# Patient Record
Sex: Male | Born: 1965 | Race: Black or African American | Hispanic: No | Marital: Married | Smoking: Former smoker
Health system: Southern US, Community
[De-identification: ages and names within clinical notes are randomized; demographics above are authoritative.]

## PROBLEM LIST (undated history)

## (undated) DIAGNOSIS — R112 Nausea with vomiting, unspecified: Secondary | ICD-10-CM

## (undated) DIAGNOSIS — Z8719 Personal history of other diseases of the digestive system: Secondary | ICD-10-CM

## (undated) DIAGNOSIS — I10 Essential (primary) hypertension: Secondary | ICD-10-CM

## (undated) DIAGNOSIS — Z9889 Other specified postprocedural states: Secondary | ICD-10-CM

## (undated) HISTORY — PX: KNEE ARTHROSCOPY W/ MENISCAL REPAIR: SHX1877

## (undated) HISTORY — PX: HERNIA REPAIR: SHX51

---

## 1998-09-06 ENCOUNTER — Emergency Department (HOSPITAL_COMMUNITY): Admission: EM | Admit: 1998-09-06 | Discharge: 1998-09-07 | Payer: Self-pay | Admitting: Emergency Medicine

## 1998-09-14 ENCOUNTER — Encounter: Admission: RE | Admit: 1998-09-14 | Discharge: 1998-09-14 | Payer: Self-pay | Admitting: *Deleted

## 1999-01-11 ENCOUNTER — Emergency Department (HOSPITAL_COMMUNITY): Admission: EM | Admit: 1999-01-11 | Discharge: 1999-01-11 | Payer: Self-pay | Admitting: Emergency Medicine

## 1999-05-12 ENCOUNTER — Encounter: Payer: Self-pay | Admitting: Emergency Medicine

## 1999-05-12 ENCOUNTER — Emergency Department (HOSPITAL_COMMUNITY): Admission: EM | Admit: 1999-05-12 | Discharge: 1999-05-13 | Payer: Self-pay | Admitting: Emergency Medicine

## 1999-07-06 ENCOUNTER — Ambulatory Visit (HOSPITAL_BASED_OUTPATIENT_CLINIC_OR_DEPARTMENT_OTHER): Admission: RE | Admit: 1999-07-06 | Discharge: 1999-07-06 | Payer: Self-pay | Admitting: General Surgery

## 1999-09-24 ENCOUNTER — Emergency Department (HOSPITAL_COMMUNITY): Admission: EM | Admit: 1999-09-24 | Discharge: 1999-09-25 | Payer: Self-pay | Admitting: Emergency Medicine

## 1999-11-24 ENCOUNTER — Emergency Department (HOSPITAL_COMMUNITY): Admission: EM | Admit: 1999-11-24 | Discharge: 1999-11-24 | Payer: Self-pay

## 2000-01-12 ENCOUNTER — Emergency Department (HOSPITAL_COMMUNITY): Admission: EM | Admit: 2000-01-12 | Discharge: 2000-01-12 | Payer: Self-pay | Admitting: Emergency Medicine

## 2000-01-14 ENCOUNTER — Emergency Department (HOSPITAL_COMMUNITY): Admission: EM | Admit: 2000-01-14 | Discharge: 2000-01-14 | Payer: Self-pay | Admitting: Emergency Medicine

## 2000-01-21 ENCOUNTER — Emergency Department (HOSPITAL_COMMUNITY): Admission: EM | Admit: 2000-01-21 | Discharge: 2000-01-21 | Payer: Self-pay | Admitting: Emergency Medicine

## 2000-02-18 ENCOUNTER — Encounter: Payer: Self-pay | Admitting: Family Medicine

## 2000-02-18 ENCOUNTER — Ambulatory Visit (HOSPITAL_COMMUNITY): Admission: RE | Admit: 2000-02-18 | Discharge: 2000-02-18 | Payer: Self-pay | Admitting: Family Medicine

## 2000-03-21 ENCOUNTER — Encounter: Payer: Self-pay | Admitting: Family Medicine

## 2000-03-21 ENCOUNTER — Ambulatory Visit (HOSPITAL_COMMUNITY): Admission: RE | Admit: 2000-03-21 | Discharge: 2000-03-21 | Payer: Self-pay | Admitting: Family Medicine

## 2000-03-28 ENCOUNTER — Encounter: Admission: RE | Admit: 2000-03-28 | Discharge: 2000-04-18 | Payer: Self-pay | Admitting: Family Medicine

## 2001-07-02 ENCOUNTER — Emergency Department (HOSPITAL_COMMUNITY): Admission: EM | Admit: 2001-07-02 | Discharge: 2001-07-02 | Payer: Self-pay | Admitting: Emergency Medicine

## 2002-12-01 ENCOUNTER — Emergency Department (HOSPITAL_COMMUNITY): Admission: EM | Admit: 2002-12-01 | Discharge: 2002-12-01 | Payer: Self-pay | Admitting: Emergency Medicine

## 2002-12-01 ENCOUNTER — Encounter: Payer: Self-pay | Admitting: Emergency Medicine

## 2006-08-01 ENCOUNTER — Emergency Department: Payer: Self-pay | Admitting: Internal Medicine

## 2006-08-01 ENCOUNTER — Other Ambulatory Visit: Payer: Self-pay

## 2006-08-02 ENCOUNTER — Ambulatory Visit: Payer: Self-pay | Admitting: Internal Medicine

## 2012-07-26 ENCOUNTER — Emergency Department: Payer: Self-pay | Admitting: Emergency Medicine

## 2012-12-12 ENCOUNTER — Other Ambulatory Visit: Payer: Self-pay | Admitting: Orthopedic Surgery

## 2012-12-17 ENCOUNTER — Encounter (HOSPITAL_COMMUNITY): Payer: Self-pay | Admitting: Pharmacy Technician

## 2012-12-18 ENCOUNTER — Other Ambulatory Visit (HOSPITAL_COMMUNITY): Payer: Self-pay

## 2012-12-18 NOTE — Pre-Procedure Instructions (Signed)
Marcus Turner  12/18/2012   Your procedure is scheduled on: Monday, August 4th    Report to [facility] at 5:30 AM.             (Come through Entrance "A", sign in at desk)  Call this number if you have problems the morning of surgery: [phone]   Remember:   Do not eat food or drink liquids after midnight Sunday.   Take these medicines the morning of surgery with A SIP OF WATER: Norvasc   Do not wear jewelry, no rings or watches.  Do not wear lotions, powders, or colognes. You may NOT wear deodorant.             Men may shave face and neck.   Do not bring valuables to the hospital.  [facility] is not responsible for any belongings or valuables.  Contacts, dentures or bridgework may not be worn into surgery.   Leave suitcase in the car. After surgery it may be brought to your room.  For patients admitted to the hospital, checkout time is 11:00 AM the day of discharge.   Name and phone number of your driver:    Special Instructions: Shower using CHG 2 nights before surgery and the night before surgery.  If you shower the day of surgery use CHG.  Use special wash - you have one bottle of CHG for all showers.  You should use approximately 1/3 of the bottle for each shower.   Please read over the following fact sheets that you were given: Pain Booklet, Coughing and Deep Breathing, Blood Transfusion Information, MRSA Information and Surgical Site Infection Prevention

## 2012-12-19 ENCOUNTER — Encounter (HOSPITAL_COMMUNITY)
Admission: RE | Admit: 2012-12-19 | Discharge: 2012-12-19 | Disposition: A | Discharge status: Home / Self Care | Payer: Self-pay | Source: Ambulatory Visit | Attending: Orthopedic Surgery | Admitting: Orthopedic Surgery

## 2012-12-19 ENCOUNTER — Encounter (HOSPITAL_COMMUNITY): Payer: Self-pay

## 2012-12-19 ENCOUNTER — Encounter (HOSPITAL_COMMUNITY)
Admission: RE | Admit: 2012-12-19 | Discharge: 2012-12-19 | Disposition: A | Discharge status: Home / Self Care | Payer: Worker's Compensation | Source: Ambulatory Visit | Attending: Orthopedic Surgery | Admitting: Orthopedic Surgery

## 2012-12-19 DIAGNOSIS — IMO0002 Reserved for concepts with insufficient information to code with codable children: Secondary | ICD-10-CM | POA: Insufficient documentation

## 2012-12-19 DIAGNOSIS — I10 Essential (primary) hypertension: Secondary | ICD-10-CM | POA: Insufficient documentation

## 2012-12-19 DIAGNOSIS — M1710 Unilateral primary osteoarthritis, unspecified knee: Secondary | ICD-10-CM | POA: Insufficient documentation

## 2012-12-19 DIAGNOSIS — Z01818 Encounter for other preprocedural examination: Secondary | ICD-10-CM | POA: Insufficient documentation

## 2012-12-19 HISTORY — DX: Essential (primary) hypertension: I10

## 2012-12-19 HISTORY — DX: Other specified postprocedural states: R11.2

## 2012-12-19 HISTORY — DX: Personal history of other diseases of the digestive system: Z87.19

## 2012-12-19 HISTORY — DX: Other specified postprocedural states: Z98.890

## 2012-12-19 LAB — URINALYSIS, ROUTINE W REFLEX MICROSCOPIC
Bilirubin Urine: NEGATIVE
Hgb urine dipstick: NEGATIVE
Ketones, ur: NEGATIVE mg/dL
Protein, ur: NEGATIVE mg/dL
Urobilinogen, UA: 0.2 mg/dL (ref 0.0–1.0)

## 2012-12-19 LAB — CBC WITH DIFFERENTIAL/PLATELET
Eosinophils Absolute: 0.1 10*3/uL (ref 0.0–0.7)
Eosinophils Relative: 2 % (ref 0–5)
Lymphs Abs: 1.8 10*3/uL (ref 0.7–4.0)
MCH: 28.2 pg (ref 26.0–34.0)
MCHC: 35.2 g/dL (ref 30.0–36.0)
MCV: 80.2 fL (ref 78.0–100.0)
Platelets: 287 10*3/uL (ref 150–400)
RBC: 5.31 MIL/uL (ref 4.22–5.81)

## 2012-12-19 LAB — PROTIME-INR: Prothrombin Time: 13.1 seconds (ref 11.6–15.2)

## 2012-12-19 LAB — ABO/RH: ABO/RH(D): B POS

## 2012-12-19 LAB — SURGICAL PCR SCREEN: MRSA, PCR: NEGATIVE

## 2012-12-19 LAB — BASIC METABOLIC PANEL
BUN: 18 mg/dL (ref 6–23)
CO2: 28 mEq/L (ref 19–32)
Calcium: 9.8 mg/dL (ref 8.4–10.5)
Creatinine, Ser: 1.16 mg/dL (ref 0.50–1.35)
Glucose, Bld: 93 mg/dL (ref 70–99)
Sodium: 139 mEq/L (ref 135–145)

## 2012-12-19 LAB — TYPE AND SCREEN

## 2012-12-19 NOTE — Progress Notes (Signed)
I have called his PCP--Dr. Terance Hart in [place] [facility])  [phone]   They do not have an old EKG for comparison.  DA

## 2012-12-19 NOTE — Progress Notes (Addendum)
12/19/12 1610  OBSTRUCTIVE SLEEP APNEA  Have you ever been diagnosed with sleep apnea through a sleep study? No  Do you snore loudly (loud enough to be heard through closed doors)?  0  Do you often feel tired, fatigued, or sleepy during the daytime? 1 (since he isn't active)  Has anyone observed you stop breathing during your sleep? 0  Do you have, or are you being treated for high blood pressure? 1  BMI more than 35 kg/m2? 1  Age over 48 years old? 0  Neck circumference greater than 40 cm/18 inches? 1  Gender: 1  Obstructive Sleep Apnea Score 5  Score 4 or greater  Results sent to PCP   He has scored a  5

## 2012-12-19 NOTE — H&P (Signed)
Marcus Turner is an 47 y.o. male.   Chief Complaint: Left knee pain with end stage arthritis  HPI: Patient presents with a chief complaint of left knee pain that was the result of a fall at work on 07/26/2012.  The patient is been treated at [facility] by Dr. Rodolph Bong, and most recently Dr. Durene Romans.  X-ray and MRI scan is consistent with bone-on-bone arthritis to the medial compartment of the left knee with subchondral edema complete loss of cartilage and peripheral osteophytes.  The patient is failed treatment with cortisone injections, physical therapy, Viscosupplementation, and oral medications.  On his most recent visit with Dr. Charlann Boxer discussion of knee replacement to place and that is pretty much the patient's desire.  It was Dr. Nilsa Nutting opinion that the work related fall represent an exacerbation of a pre-existing condition since the patient did not have any significant symptoms prior to the fall.  Past Medical History  Diagnosis Date  . PONV (postoperative nausea and vomiting)     some nausea...no vomiting  . Hypertension   . H/O hiatal hernia     Past Surgical History  Procedure Laterality Date  . Knee arthroscopy w/ meniscal repair      right  . Hernia repair      ventral    No family history on file. Social History:  reports that he has quit smoking. He does not have any smokeless tobacco history on file. He reports that  drinks alcohol. He reports that he does not use illicit drugs.  Allergies:  Allergies  Allergen Reactions  . Tramadol Nausea Only    No prescriptions prior to admission    Results for orders placed during the hospital encounter of 12/19/12 (from the past 48 hour(s))  URINALYSIS, ROUTINE W REFLEX MICROSCOPIC     Status: None   Collection Time    12/19/12  9:01 AM      Result Value Range   Color, Urine YELLOW  YELLOW   APPearance CLEAR  CLEAR   Specific Gravity, Urine 1.021  1.005 - 1.030   pH 6.5  5.0 - 8.0   Glucose, UA NEGATIVE   NEGATIVE mg/dL   Hgb urine dipstick NEGATIVE  NEGATIVE   Bilirubin Urine NEGATIVE  NEGATIVE   Ketones, ur NEGATIVE  NEGATIVE mg/dL   Protein, ur NEGATIVE  NEGATIVE mg/dL   Urobilinogen, UA 0.2  0.0 - 1.0 mg/dL   Nitrite NEGATIVE  NEGATIVE   Leukocytes, UA NEGATIVE  NEGATIVE   Comment: MICROSCOPIC NOT DONE ON URINES WITH NEGATIVE PROTEIN, BLOOD, LEUKOCYTES, NITRITE, OR GLUCOSE <1000 mg/dL.  APTT     Status: None   Collection Time    12/19/12 10:10 AM      Result Value Range   aPTT 25  24 - 37 seconds  BASIC METABOLIC PANEL     Status: Abnormal   Collection Time    12/19/12 10:10 AM      Result Value Range   Sodium 139  135 - 145 mEq/L   Potassium 3.9  3.5 - 5.1 mEq/L   Chloride 103  96 - 112 mEq/L   CO2 28  19 - 32 mEq/L   Glucose, Bld 93  70 - 99 mg/dL   BUN 18  6 - 23 mg/dL   Creatinine, Ser 4.54  0.50 - 1.35 mg/dL   Calcium 9.8  8.4 - 09.8 mg/dL   GFR calc non Af Amer 73 (*) >90 mL/min   GFR calc Af Amer 84 (*) >  90 mL/min   Comment:            The eGFR has been calculated     using the CKD EPI equation.     This calculation has not been     validated in all clinical     situations.     eGFR's persistently     <90 mL/min signify     possible Chronic Kidney Disease.  CBC WITH DIFFERENTIAL     Status: None   Collection Time    12/19/12 10:10 AM      Result Value Range   WBC 5.7  4.0 - 10.5 K/uL   RBC 5.31  4.22 - 5.81 MIL/uL   Hemoglobin 15.0  13.0 - 17.0 g/dL   HCT 16.1  09.6 - 04.5 %   MCV 80.2  78.0 - 100.0 fL   MCH 28.2  26.0 - 34.0 pg   MCHC 35.2  30.0 - 36.0 g/dL   RDW 40.9  81.1 - 91.4 %   Platelets 287  150 - 400 K/uL   Neutrophils Relative % 57  43 - 77 %   Neutro Abs 3.3  1.7 - 7.7 K/uL   Lymphocytes Relative 32  12 - 46 %   Lymphs Abs 1.8  0.7 - 4.0 K/uL   Monocytes Relative 9  3 - 12 %   Monocytes Absolute 0.5  0.1 - 1.0 K/uL   Eosinophils Relative 2  0 - 5 %   Eosinophils Absolute 0.1  0.0 - 0.7 K/uL   Basophils Relative 1  0 - 1 %   Basophils  Absolute 0.0  0.0 - 0.1 K/uL  PROTIME-INR     Status: None   Collection Time    12/19/12 10:10 AM      Result Value Range   Prothrombin Time 13.1  11.6 - 15.2 seconds   INR 1.01  0.00 - 1.49  TYPE AND SCREEN     Status: None   Collection Time    12/19/12 10:15 AM      Result Value Range   ABO/RH(D) B POS     Antibody Screen NEG     Sample Expiration 01/02/2013    ABO/RH     Status: None   Collection Time    12/19/12 10:15 AM      Result Value Range   ABO/RH(D) B POS    SURGICAL PCR SCREEN     Status: None   Collection Time    12/19/12 10:20 AM      Result Value Range   MRSA, PCR NEGATIVE  NEGATIVE   Staphylococcus aureus NEGATIVE  NEGATIVE   Comment:            The Xpert SA Assay (FDA     approved for NASAL specimens     in patients over 61 years of age),     is one component of     a comprehensive surveillance     program.  Test performance has     been validated by [employer] for patients greater     than or equal to 65 year old.     It is not intended     to diagnose infection nor to     guide or monitor treatment.   Dg Chest 2 View  12/19/2012   *RADIOLOGY REPORT*  Clinical Data: Preoperative respiratory exam.  Severe osteoarthritis of the left knee.  CHEST - 2 VIEW  Comparison: None.  Findings: The heart size and pulmonary vascularity are normal and the lungs are clear.  No osseous abnormality.  IMPRESSION: Normal chest.   Original Report Authenticated By: Francene Boyers, M.D.    Review of Systems  Constitutional: Negative.   HENT: Positive for hearing loss.   Eyes: Negative.        Pt wears glasses  Respiratory: Negative.   Cardiovascular: Negative.   Gastrointestinal: Negative.   Genitourinary: Negative.   Musculoskeletal: Positive for joint pain.       Left knee pain  Skin: Negative.   Neurological: Negative.   Endo/Heme/Allergies: Negative.     There were no vitals taken for this visit. Physical Exam  Constitutional: He is oriented to person,  place, and time. He appears well-developed and well-nourished.  HENT:  Head: Normocephalic and atraumatic.  Neck: Normal range of motion. Neck supple.  Cardiovascular: Intact distal pulses.   Respiratory: Effort normal and breath sounds normal.  Musculoskeletal: Normal range of motion. He exhibits tenderness.  Left knee pain  Neurological: He is alert and oriented to person, place, and time. He has normal reflexes.  Skin: Skin is warm and dry.  Psychiatric: He has a normal mood and affect. His behavior is normal. Judgment and thought content normal.     Assessment/Plan Assess: End-stage osteoarthritis of the left knee, exacerbated by injury at work, has failed conservative treatment with anti-inflammatory medicines, cortisone shots, physical therapy, and Viscosupplementation.  Plan: I would concur with Dr. Durene Romans that the patient's only reasonable option at this time is total knee replacement.  The risks benefits of surgery were discussed at length and the patient would like to proceed with surgery.  If the patient desires to have the surgery done here of be happy to do that if it is okay with the insurance adjuster.  Expected hospitalization would be one to 2 days.  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  PHILLIPS, ERIC R 12/19/2012, 4:37 PM

## 2012-12-19 NOTE — Progress Notes (Signed)
Anesthesia chart review:  Patient is a 47 year old male scheduled for left TKA on 12/22/12 by Dr. Turner Daniels.  History includes former smoker, HTN, hiatal hernia, post-operative N/V, morbid obesity. OSA screening score was a 5.  PCP is Dr. Terance Hart in [place].   EKG on 12/22/12 showed NSR, LAFB, non-specific T wave abnormality.  There are no comparison EKGs in [facility], Epic, or at his PCP office.  No CV symptoms were documented at his PAT appointment.  He has no documented history of MI/CHF or DM.  Preoperative CXR and labs noted.  Patient will be evaluated by his assigned anesthesiologist on the day of surgery.  If he remains asymptomatic from a CV standpoint then I would anticipate that he could proceed as planned.  Velna Ochs [facility]/Anesthesiology Phone [phone] 12/19/2012 12:24 PM

## 2012-12-21 DIAGNOSIS — M1712 Unilateral primary osteoarthritis, left knee: Secondary | ICD-10-CM

## 2012-12-21 MED ORDER — CHLORHEXIDINE GLUCONATE 4 % EX LIQD: 60.0000 mL | Freq: Once | CUTANEOUS | Status: DC

## 2012-12-21 MED ORDER — DEXTROSE-NACL 5-0.45 % IV SOLN: INTRAVENOUS | Status: DC

## 2012-12-21 MED ORDER — CEFAZOLIN SODIUM-DEXTROSE 2-3 GM-% IV SOLR
2.0000 g | INTRAVENOUS | Status: AC
  Administered 2012-12-22: 2 g via INTRAVENOUS
  Filled 2012-12-21: qty 50

## 2012-12-22 ENCOUNTER — Encounter (HOSPITAL_COMMUNITY): Payer: Self-pay | Admitting: Vascular Surgery

## 2012-12-22 ENCOUNTER — Encounter (HOSPITAL_COMMUNITY)
Admission: RE | Disposition: A | Discharge status: Home Health | Payer: Self-pay | Source: Ambulatory Visit | Attending: Orthopedic Surgery

## 2012-12-22 ENCOUNTER — Inpatient Hospital Stay (HOSPITAL_COMMUNITY)
Admission: RE | Admit: 2012-12-22 | Discharge: 2012-12-24 | DRG: 470 | Disposition: A | Discharge status: Home Health | Payer: Worker's Compensation | Source: Ambulatory Visit | Attending: Orthopedic Surgery | Admitting: Orthopedic Surgery

## 2012-12-22 ENCOUNTER — Encounter (HOSPITAL_COMMUNITY): Payer: Self-pay | Admitting: *Deleted

## 2012-12-22 ENCOUNTER — Inpatient Hospital Stay (HOSPITAL_COMMUNITY): Payer: Worker's Compensation | Admitting: Anesthesiology

## 2012-12-22 DIAGNOSIS — M1710 Unilateral primary osteoarthritis, unspecified knee: Principal | ICD-10-CM | POA: Diagnosis present

## 2012-12-22 DIAGNOSIS — M1712 Unilateral primary osteoarthritis, left knee: Secondary | ICD-10-CM

## 2012-12-22 DIAGNOSIS — I10 Essential (primary) hypertension: Secondary | ICD-10-CM | POA: Diagnosis present

## 2012-12-22 DIAGNOSIS — Z7982 Long term (current) use of aspirin: Secondary | ICD-10-CM

## 2012-12-22 DIAGNOSIS — Z01812 Encounter for preprocedural laboratory examination: Secondary | ICD-10-CM

## 2012-12-22 DIAGNOSIS — Z79899 Other long term (current) drug therapy: Secondary | ICD-10-CM

## 2012-12-22 HISTORY — PX: TOTAL KNEE ARTHROPLASTY: SHX125

## 2012-12-22 SURGERY — ARTHROPLASTY, KNEE, TOTAL
Anesthesia: Choice | Site: Knee | Laterality: Left | Wound class: Clean

## 2012-12-22 MED ORDER — ONDANSETRON HCL 4 MG PO TABS
4.0000 mg | ORAL_TABLET | Freq: Four times a day (QID) | ORAL | Status: DC | PRN
  Administered 2012-12-23: 4 mg via ORAL
  Filled 2012-12-22: qty 1

## 2012-12-22 MED ORDER — ONDANSETRON HCL 4 MG/2ML IJ SOLN
INTRAMUSCULAR | Status: DC | PRN
  Administered 2012-12-22: 4 mg via INTRAVENOUS

## 2012-12-22 MED ORDER — FENTANYL CITRATE 0.05 MG/ML IJ SOLN
INTRAMUSCULAR | Status: DC | PRN
  Administered 2012-12-22: 50 ug via INTRAVENOUS
  Administered 2012-12-22: 150 ug via INTRAVENOUS
  Administered 2012-12-22 (×3): 50 ug via INTRAVENOUS
  Administered 2012-12-22: 100 ug via INTRAVENOUS
  Administered 2012-12-22 (×4): 50 ug via INTRAVENOUS

## 2012-12-22 MED ORDER — BISACODYL 5 MG PO TBEC
5.0000 mg | DELAYED_RELEASE_TABLET | Freq: Every day | ORAL | Status: DC | PRN
  Administered 2012-12-23: 5 mg via ORAL
  Filled 2012-12-22: qty 1

## 2012-12-22 MED ORDER — HYDROMORPHONE HCL PF 1 MG/ML IJ SOLN
1.0000 mg | INTRAMUSCULAR | Status: DC | PRN
  Administered 2012-12-22 – 2012-12-23 (×6): 1 mg via INTRAVENOUS
  Filled 2012-12-22 (×6): qty 1

## 2012-12-22 MED ORDER — METOCLOPRAMIDE HCL 10 MG PO TABS
5.0000 mg | ORAL_TABLET | Freq: Three times a day (TID) | ORAL | Status: DC | PRN
  Administered 2012-12-23: 10 mg via ORAL
  Filled 2012-12-22: qty 1

## 2012-12-22 MED ORDER — DIPHENHYDRAMINE HCL 12.5 MG/5ML PO ELIX: 12.5000 mg | ORAL_SOLUTION | ORAL | Status: DC | PRN

## 2012-12-22 MED ORDER — TRANEXAMIC ACID 100 MG/ML IV SOLN
1000.0000 mg | INTRAVENOUS | Status: AC
  Administered 2012-12-22: 1000 mg via INTRAVENOUS
  Filled 2012-12-22: qty 10

## 2012-12-22 MED ORDER — FENTANYL CITRATE 0.05 MG/ML IJ SOLN
25.0000 ug | INTRAMUSCULAR | Status: DC | PRN
  Administered 2012-12-22: 25 ug via INTRAVENOUS
  Administered 2012-12-22: 50 ug via INTRAVENOUS
  Administered 2012-12-22: 25 ug via INTRAVENOUS
  Administered 2012-12-22: 50 ug via INTRAVENOUS

## 2012-12-22 MED ORDER — OXYCODONE HCL 5 MG PO TABS
5.0000 mg | ORAL_TABLET | ORAL | Status: DC | PRN
  Administered 2012-12-22 – 2012-12-23 (×5): 10 mg via ORAL
  Administered 2012-12-23: 5 mg via ORAL
  Administered 2012-12-23 – 2012-12-24 (×6): 10 mg via ORAL
  Filled 2012-12-22 (×12): qty 2

## 2012-12-22 MED ORDER — OXYCODONE HCL 5 MG/5ML PO SOLN: 5.0000 mg | Freq: Once | ORAL | Status: AC | PRN

## 2012-12-22 MED ORDER — PROPOFOL 10 MG/ML IV BOLUS
INTRAVENOUS | Status: DC | PRN
  Administered 2012-12-22: 200 mg via INTRAVENOUS
  Administered 2012-12-22: 150 mg via INTRAVENOUS

## 2012-12-22 MED ORDER — KCL IN DEXTROSE-NACL 20-5-0.45 MEQ/L-%-% IV SOLN
INTRAVENOUS | Status: AC
  Filled 2012-12-22: qty 1000

## 2012-12-22 MED ORDER — FENTANYL CITRATE 0.05 MG/ML IJ SOLN
INTRAMUSCULAR | Status: AC
  Filled 2012-12-22: qty 2

## 2012-12-22 MED ORDER — OXYCODONE HCL 5 MG PO TABS
ORAL_TABLET | ORAL | Status: AC
  Filled 2012-12-22: qty 1

## 2012-12-22 MED ORDER — ALUM & MAG HYDROXIDE-SIMETH 200-200-20 MG/5ML PO SUSP: 30.0000 mL | ORAL | Status: DC | PRN

## 2012-12-22 MED ORDER — LABETALOL HCL 5 MG/ML IV SOLN
INTRAVENOUS | Status: DC | PRN
  Administered 2012-12-22 (×3): 5 mg via INTRAVENOUS

## 2012-12-22 MED ORDER — MAGNESIUM CITRATE PO SOLN
1.0000 | Freq: Once | ORAL | Status: AC | PRN
  Filled 2012-12-22: qty 296

## 2012-12-22 MED ORDER — PHENOL 1.4 % MT LIQD: 1.0000 | OROMUCOSAL | Status: DC | PRN

## 2012-12-22 MED ORDER — NEOSTIGMINE METHYLSULFATE 1 MG/ML IJ SOLN
INTRAMUSCULAR | Status: DC | PRN
  Administered 2012-12-22: 5 mg via INTRAVENOUS

## 2012-12-22 MED ORDER — AMLODIPINE BESYLATE 5 MG PO TABS
5.0000 mg | ORAL_TABLET | Freq: Every day | ORAL | Status: DC
  Administered 2012-12-24: 5 mg via ORAL
  Filled 2012-12-22 (×2): qty 1

## 2012-12-22 MED ORDER — MIDAZOLAM HCL 5 MG/5ML IJ SOLN
INTRAMUSCULAR | Status: DC | PRN
  Administered 2012-12-22: 2 mg via INTRAVENOUS

## 2012-12-22 MED ORDER — METHOCARBAMOL 500 MG PO TABS
500.0000 mg | ORAL_TABLET | Freq: Four times a day (QID) | ORAL | Status: DC | PRN
  Administered 2012-12-22 – 2012-12-24 (×6): 500 mg via ORAL
  Filled 2012-12-22 (×7): qty 1

## 2012-12-22 MED ORDER — ONDANSETRON HCL 4 MG/2ML IJ SOLN
4.0000 mg | Freq: Four times a day (QID) | INTRAMUSCULAR | Status: DC | PRN
  Administered 2012-12-22: 4 mg via INTRAVENOUS
  Filled 2012-12-22 (×2): qty 2

## 2012-12-22 MED ORDER — MENTHOL 3 MG MT LOZG: 1.0000 | LOZENGE | OROMUCOSAL | Status: DC | PRN

## 2012-12-22 MED ORDER — LISINOPRIL 5 MG PO TABS
5.0000 mg | ORAL_TABLET | Freq: Every day | ORAL | Status: DC
  Administered 2012-12-22 – 2012-12-24 (×2): 5 mg via ORAL
  Filled 2012-12-22 (×3): qty 1

## 2012-12-22 MED ORDER — METOCLOPRAMIDE HCL 5 MG/ML IJ SOLN
5.0000 mg | Freq: Three times a day (TID) | INTRAMUSCULAR | Status: DC | PRN
  Administered 2012-12-23: 10 mg via INTRAVENOUS
  Filled 2012-12-22 (×2): qty 2

## 2012-12-22 MED ORDER — BUPIVACAINE LIPOSOME 1.3 % IJ SUSP
20.0000 mL | Freq: Once | INTRAMUSCULAR | Status: DC
  Filled 2012-12-22: qty 20

## 2012-12-22 MED ORDER — CEFUROXIME SODIUM 1.5 G IJ SOLR
INTRAMUSCULAR | Status: DC | PRN
  Administered 2012-12-22: 1.5 g

## 2012-12-22 MED ORDER — SODIUM CHLORIDE 0.9 % IR SOLN
Status: DC | PRN
  Administered 2012-12-22: 1000 mL

## 2012-12-22 MED ORDER — SUCCINYLCHOLINE CHLORIDE 20 MG/ML IJ SOLN
INTRAMUSCULAR | Status: DC | PRN
  Administered 2012-12-22: 160 mg via INTRAVENOUS

## 2012-12-22 MED ORDER — SENNOSIDES-DOCUSATE SODIUM 8.6-50 MG PO TABS
1.0000 | ORAL_TABLET | Freq: Every evening | ORAL | Status: DC | PRN
  Administered 2012-12-23: 1 via ORAL
  Filled 2012-12-22: qty 1

## 2012-12-22 MED ORDER — ONDANSETRON HCL 4 MG/2ML IJ SOLN: 4.0000 mg | Freq: Four times a day (QID) | INTRAMUSCULAR | Status: DC | PRN

## 2012-12-22 MED ORDER — KCL IN DEXTROSE-NACL 20-5-0.45 MEQ/L-%-% IV SOLN
INTRAVENOUS | Status: DC
Start: 2012-12-22 — End: 2012-12-24
  Administered 2012-12-22 – 2012-12-23 (×3): via INTRAVENOUS
  Filled 2012-12-22 (×11): qty 1000

## 2012-12-22 MED ORDER — ACETAMINOPHEN 325 MG PO TABS
650.0000 mg | ORAL_TABLET | Freq: Four times a day (QID) | ORAL | Status: DC | PRN
  Administered 2012-12-22 – 2012-12-24 (×5): 650 mg via ORAL
  Filled 2012-12-22 (×5): qty 2

## 2012-12-22 MED ORDER — ASPIRIN EC 325 MG PO TBEC
325.0000 mg | DELAYED_RELEASE_TABLET | Freq: Every day | ORAL | Status: DC
  Administered 2012-12-23 – 2012-12-24 (×2): 325 mg via ORAL
  Filled 2012-12-22 (×3): qty 1

## 2012-12-22 MED ORDER — METHOCARBAMOL 100 MG/ML IJ SOLN
500.0000 mg | Freq: Four times a day (QID) | INTRAVENOUS | Status: DC | PRN
  Administered 2012-12-22: 500 mg via INTRAVENOUS
  Filled 2012-12-22: qty 5

## 2012-12-22 MED ORDER — ROCURONIUM BROMIDE 100 MG/10ML IV SOLN
INTRAVENOUS | Status: DC | PRN
  Administered 2012-12-22: 50 mg via INTRAVENOUS

## 2012-12-22 MED ORDER — ACETAMINOPHEN 650 MG RE SUPP: 650.0000 mg | Freq: Four times a day (QID) | RECTAL | Status: DC | PRN

## 2012-12-22 MED ORDER — ARTIFICIAL TEARS OP OINT
TOPICAL_OINTMENT | OPHTHALMIC | Status: DC | PRN
  Administered 2012-12-22: 1 via OPHTHALMIC

## 2012-12-22 MED ORDER — HYDROMORPHONE HCL PF 1 MG/ML IJ SOLN
0.5000 mg | INTRAMUSCULAR | Status: DC | PRN
  Administered 2012-12-22 (×3): 0.5 mg via INTRAVENOUS
  Filled 2012-12-22 (×3): qty 1

## 2012-12-22 MED ORDER — DOCUSATE SODIUM 100 MG PO CAPS
100.0000 mg | ORAL_CAPSULE | Freq: Two times a day (BID) | ORAL | Status: DC
  Administered 2012-12-22 – 2012-12-24 (×4): 100 mg via ORAL
  Filled 2012-12-22 (×5): qty 1

## 2012-12-22 MED ORDER — GLYCOPYRROLATE 0.2 MG/ML IJ SOLN
INTRAMUSCULAR | Status: DC | PRN
  Administered 2012-12-22: .8 mg via INTRAVENOUS

## 2012-12-22 MED ORDER — FENTANYL CITRATE 0.05 MG/ML IJ SOLN
INTRAMUSCULAR | Status: AC
  Administered 2012-12-22: 50 ug via INTRAVENOUS
  Filled 2012-12-22: qty 2

## 2012-12-22 MED ORDER — LACTATED RINGERS IV SOLN
INTRAVENOUS | Status: DC | PRN
  Administered 2012-12-22 (×3): via INTRAVENOUS

## 2012-12-22 MED ORDER — OXYCODONE HCL 5 MG PO TABS
5.0000 mg | ORAL_TABLET | Freq: Once | ORAL | Status: AC | PRN
  Administered 2012-12-22: 5 mg via ORAL

## 2012-12-22 MED ORDER — SODIUM CHLORIDE 0.9 % IJ SOLN
INTRAMUSCULAR | Status: DC | PRN
  Administered 2012-12-22: 09:00:00

## 2012-12-22 MED ORDER — LIDOCAINE HCL (CARDIAC) 20 MG/ML IV SOLN
INTRAVENOUS | Status: DC | PRN
  Administered 2012-12-22: 100 mg via INTRAVENOUS

## 2012-12-22 SURGICAL SUPPLY — 59 items
BANDAGE ESMARK 6X9 LF (GAUZE/BANDAGES/DRESSINGS) ×1 IMPLANT
BLADE SAG 18X100X1.27 (BLADE) ×2 IMPLANT
BLADE SAW SGTL 13X75X1.27 (BLADE) ×2 IMPLANT
BLADE SURG ROTATE 9660 (MISCELLANEOUS) IMPLANT
BNDG ELASTIC 6X10 VLCR STRL LF (GAUZE/BANDAGES/DRESSINGS) ×2 IMPLANT
BNDG ELASTIC 6X15 VLCR STRL LF (GAUZE/BANDAGES/DRESSINGS) ×2 IMPLANT
BNDG ESMARK 6X9 LF (GAUZE/BANDAGES/DRESSINGS) ×2
BOWL SMART MIX CTS (DISPOSABLE) ×2 IMPLANT
CAPT RP KNEE ×2 IMPLANT
CEMENT HV SMART SET (Cement) ×4 IMPLANT
CLOTH BEACON ORANGE TIMEOUT ST (SAFETY) ×2 IMPLANT
COVER SURGICAL LIGHT HANDLE (MISCELLANEOUS) ×2 IMPLANT
CUFF TOURNIQUET SINGLE 34IN LL (TOURNIQUET CUFF) IMPLANT
CUFF TOURNIQUET SINGLE 44IN (TOURNIQUET CUFF) ×2 IMPLANT
DRAPE EXTREMITY T 121X128X90 (DRAPE) ×2 IMPLANT
DRAPE U-SHAPE 47X51 STRL (DRAPES) ×2 IMPLANT
DRSG PAD ABDOMINAL 8X10 ST (GAUZE/BANDAGES/DRESSINGS) ×4 IMPLANT
DURAPREP 26ML APPLICATOR (WOUND CARE) ×4 IMPLANT
ELECT REM PT RETURN 9FT ADLT (ELECTROSURGICAL) ×2
ELECTRODE REM PT RTRN 9FT ADLT (ELECTROSURGICAL) ×1 IMPLANT
EVACUATOR 1/8 PVC DRAIN (DRAIN) ×2 IMPLANT
GAUZE XEROFORM 1X8 LF (GAUZE/BANDAGES/DRESSINGS) ×2 IMPLANT
GLOVE BIO SURGEON STRL SZ7.5 (GLOVE) ×4 IMPLANT
GLOVE BIO SURGEON STRL SZ8.5 (GLOVE) ×4 IMPLANT
GLOVE BIOGEL PI IND STRL 8 (GLOVE) ×2 IMPLANT
GLOVE BIOGEL PI IND STRL 9 (GLOVE) ×1 IMPLANT
GLOVE BIOGEL PI INDICATOR 8 (GLOVE) ×2
GLOVE BIOGEL PI INDICATOR 9 (GLOVE) ×1
GLOVE NEODERM STER SZ 7 (GLOVE) ×2 IMPLANT
GOWN PREVENTION PLUS XLARGE (GOWN DISPOSABLE) IMPLANT
GOWN STRL NON-REIN LRG LVL3 (GOWN DISPOSABLE) IMPLANT
GOWN STRL REIN XL XLG (GOWN DISPOSABLE) IMPLANT
HANDPIECE INTERPULSE COAX TIP (DISPOSABLE) ×1
HOOD PEEL AWAY FACE SHEILD DIS (HOOD) ×4 IMPLANT
KIT BASIN OR (CUSTOM PROCEDURE TRAY) ×2 IMPLANT
KIT ROOM TURNOVER OR (KITS) ×2 IMPLANT
MANIFOLD NEPTUNE II (INSTRUMENTS) ×2 IMPLANT
NEEDLE 18GX1X1/2 (RX/OR ONLY) (NEEDLE) ×2 IMPLANT
NS IRRIG 1000ML POUR BTL (IV SOLUTION) ×2 IMPLANT
PACK TOTAL JOINT (CUSTOM PROCEDURE TRAY) ×2 IMPLANT
PAD ARMBOARD 7.5X6 YLW CONV (MISCELLANEOUS) ×4 IMPLANT
PAD CAST 4YDX4 CTTN HI CHSV (CAST SUPPLIES) ×1 IMPLANT
PADDING CAST COTTON 4X4 STRL (CAST SUPPLIES) ×1
PADDING CAST COTTON 6X4 STRL (CAST SUPPLIES) ×2 IMPLANT
SET HNDPC FAN SPRY TIP SCT (DISPOSABLE) ×1 IMPLANT
SPONGE GAUZE 4X4 12PLY (GAUZE/BANDAGES/DRESSINGS) ×2 IMPLANT
STAPLER VISISTAT 35W (STAPLE) ×2 IMPLANT
SUCTION FRAZIER TIP 10 FR DISP (SUCTIONS) ×2 IMPLANT
SUT VIC AB 0 CTX 36 (SUTURE) ×1
SUT VIC AB 0 CTX36XBRD ANTBCTR (SUTURE) ×1 IMPLANT
SUT VIC AB 1 CTX 36 (SUTURE) ×1
SUT VIC AB 1 CTX36XBRD ANBCTR (SUTURE) ×1 IMPLANT
SUT VIC AB 2-0 CT1 27 (SUTURE) ×1
SUT VIC AB 2-0 CT1 TAPERPNT 27 (SUTURE) ×1 IMPLANT
SYR 50ML LL SCALE MARK (SYRINGE) ×2 IMPLANT
TOWEL OR 17X24 6PK STRL BLUE (TOWEL DISPOSABLE) ×2 IMPLANT
TOWEL OR 17X26 10 PK STRL BLUE (TOWEL DISPOSABLE) ×2 IMPLANT
TRAY FOLEY CATH 16FRSI W/METER (SET/KITS/TRAYS/PACK) ×2 IMPLANT
WATER STERILE IRR 1000ML POUR (IV SOLUTION) ×4 IMPLANT

## 2012-12-22 NOTE — Progress Notes (Signed)
UR COMPLETED  

## 2012-12-22 NOTE — Transfer of Care (Signed)
Immediate Anesthesia Transfer of Care Note  Patient: Marcus Turner  Procedure(s) Performed: Procedure(s): TOTAL KNEE ARTHROPLASTY- left (Left)  Patient Location: PACU  Anesthesia Type:General  Level of Consciousness: oriented, sedated and patient cooperative  Airway & Oxygen Therapy: Patient Spontanous Breathing and Patient connected to face mask oxygen  Post-op Assessment: Report given to PACU RN, Post -op Vital signs reviewed and stable and Patient moving all extremities X 4  Post vital signs: Reviewed and stable  Complications: No apparent anesthesia complications

## 2012-12-22 NOTE — Progress Notes (Signed)
Report given to Philip RN

## 2012-12-22 NOTE — Anesthesia Preprocedure Evaluation (Addendum)
Anesthesia Evaluation  Patient identified by MRN, date of birth, ID band Patient awake    Reviewed: Allergy & Precautions, H&P , NPO status , Patient's Chart, lab work & pertinent test results  History of Anesthesia Complications (+) PONV  Airway Mallampati: II  Neck ROM: full    Dental   Pulmonary former smoker,          Cardiovascular hypertension,     Neuro/Psych    GI/Hepatic hiatal hernia,   Endo/Other  Morbid obesity  Renal/GU      Musculoskeletal   Abdominal   Peds  Hematology   Anesthesia Other Findings   Reproductive/Obstetrics                           Anesthesia Physical Anesthesia Plan  ASA: II  Anesthesia Plan: General   Post-op Pain Management:    Induction: Intravenous  Airway Management Planned: Oral ETT  Additional Equipment:   Intra-op Plan:   Post-operative Plan: Extubation in OR  Informed Consent: I have reviewed the patients History and Physical, chart, labs and discussed the procedure including the risks, benefits and alternatives for the proposed anesthesia with the patient or authorized representative who has indicated his/her understanding and acceptance.   Dental advisory given  Plan Discussed with: Anesthesiologist, CRNA and Surgeon  Anesthesia Plan Comments:        Anesthesia Quick Evaluation

## 2012-12-22 NOTE — Progress Notes (Signed)
Orthopedic Tech Progress Note Patient Details:  Marcus Turner 03/24/1966 [id number] Applied CPM to LLE.  Applied OHF with trapeze to bed. CPM Left Knee CPM Left Knee: On Left Knee Flexion (Degrees): 60 Left Knee Extension (Degrees): 0 CPM Right Knee CPM Right Knee: On Right Knee Flexion (Degrees): 60 Right Knee Extension (Degrees): 0 Additional Comments:  (trapeze bar)   Lesle Chris 12/22/2012, 11:46 AM

## 2012-12-22 NOTE — Op Note (Signed)
PATIENT ID:      Marcus Turner  MRN:     [id number] DOB/AGE:    February 06, 1966 / 47 y.o.       OPERATIVE REPORT    DATE OF PROCEDURE:  12/22/2012       PREOPERATIVE DIAGNOSIS:   OSTEOARTHRITIS LEFT KNEE      There is no weight on file to calculate BMI.                                                        POSTOPERATIVE DIAGNOSIS:   OSTEOARTHRITIS LEFT KNEE                                                                      PROCEDURE:  Procedure(s): TOTAL KNEE ARTHROPLASTY- left Using Depuy Sigma RP implants #5L Femur, #5Tibia, 10mm sigma RP bearing, 41 Patella     SURGEON: Hawkins Seaman J    ASSISTANT:   Eric K. [employer]   (Present and scrubbed throughout the case, critical for assistance with exposure, retraction, instrumentation, and closure.)         ANESTHESIA: GET Exparel  DRAINS: foley, 2 medium hemovac in knee   TOURNIQUET TIME:   COMPLICATIONS:  None     SPECIMENS: None   INDICATIONS FOR PROCEDURE: The patient has  OSTEOARTHRITIS LEFT KNEE, varus deformities, XR shows bone on bone arthritis. Patient has failed all conservative measures including anti-inflammatory medicines, narcotics, attempts at  exercise and weight loss, cortisone injections and viscosupplementation.  Risks and benefits of surgery have been discussed, questions answered.   DESCRIPTION OF PROCEDURE: The patient identified by armband, received  IV antibiotics, in the holding area at [facility]. Patient taken to the operating room, appropriate anesthetic  monitors were attached, and general endotracheal anesthesia induced with  the patient in supine position, Foley catheter was inserted. Tourniquet  applied high to the operative thigh. Lateral post and foot positioner  applied to the table, the lower extremity was then prepped and draped  in usual sterile fashion from the ankle to the tourniquet. Time-out procedure was performed. The limb was wrapped with an Esmarch bandage and the tourniquet  inflated to 350 mmHg. We began the operation by making the anterior midline incision starting at handbreadth above the patella going over the patella 1 cm medial to and  4 cm distal to the tibial tubercle. Small bleeders in the skin and the  subcutaneous tissue identified and cauterized. Transverse retinaculum was incised and reflected medially and a medial parapatellar arthrotomy was accomplished. the patella was everted and theprepatellar fat pad resected. The superficial medial collateral  ligament was then elevated from anterior to posterior along the proximal  flare of the tibia and anterior half of the menisci resected. The knee was hyperflexed exposing bone on bone arthritis. Peripheral and notch osteophytes as well as the cruciate ligaments were then resected. We continued to  work our way around posteriorly along the proximal tibia, and externally  rotated the tibia subluxing it out from underneath the femur. A McHale  retractor was placed through  the notch and a lateral Hohmann retractor  placed, and we then drilled through the proximal tibia in line with the  axis of the tibia followed by an intramedullary guide rod and 2-degree  posterior slope cutting guide. The tibial cutting guide was pinned into place  allowing resection of 5 mm of bone medially and about 10 mm of bone  laterally because of her varus deformity. Satisfied with the tibial resection, we then  entered the distal femur 2 mm anterior to the PCL origin with the  intramedullary guide rod and applied the distal femoral cutting guide  set at 11mm, with 5 degrees of valgus. This was pinned along the  epicondylar axis. At this point, the distal femoral cut was accomplished without difficulty. We then sized for a #5L femoral component and pinned the guide in 3 degrees of external rotation.The chamfer cutting guide was pinned into place. The anterior, posterior, and chamfer cuts were accomplished without difficulty followed by   the Sigma RP box cutting guide and the box cut. We also removed posterior osteophytes from the posterior femoral condyles. At this  time, the knee was brought into full extension. We checked our  extension and flexion gaps and found them symmetric at 10mm.  The patella thickness measured at 26 mm. We set the cutting guide at 15 and removed the posterior 11 mm  of the patella, sized for a 41 button and drilled the lollipop. The knee  was then once again hyperflexed exposing the proximal tibia. We sized for a #5 tibial base plate, applied the smokestack and the conical reamer followed by the the Delta fin keel punch. We then hammered into place the Sigma RP trial femoral component, inserted a 10-mm trial bearing, trial patellar button, and took the knee through range of motion from 0-130 degrees. No thumb pressure was required for patellar  tracking. At this point, all trial components were removed, a double batch of DePuy HV cement with 1500 mg of Zinacef was mixed and applied to all bony metallic mating surfaces except for the posterior condyles of the femur itself. In order, we  hammered into place the tibial tray and removed excess cement, the femoral component and removed excess cement, a 10-mm Sigma RP bearing  was inserted, and the knee brought to full extension with compression.  The patellar button was clamped into place, and excess cement  removed. While the cement cured the wound was irrigated out with normal saline solution pulse lavage, and medium Hemovac drains were placed from an anterolateral  approach. Ligament stability and patellar tracking were checked and found to be excellent. The parapatellar arthrotomy was closed with  running #1 Vicryl suture. The subcutaneous tissue with 0 and 2-0 undyed  Vicryl suture, and the skin with skin staples. A dressing of Xeroform,  4 x 4, dressing sponges, Webril, and Ace wrap applied. The patient  awakened, extubated, and taken to recovery room  without difficulty.   Nalayah Hitt J 12/22/2012, 9:18 AM

## 2012-12-22 NOTE — Anesthesia Procedure Notes (Signed)
Procedure Name: Intubation Date/Time: 12/22/2012 7:41 AM Performed by: Sherie Don Pre-anesthesia Checklist: Patient identified, Emergency Drugs available, Suction available, Patient being monitored and Timeout performed Patient Re-evaluated:Patient Re-evaluated prior to inductionOxygen Delivery Method: Circle system utilized Preoxygenation: Pre-oxygenation with 100% oxygen Intubation Type: IV induction, Rapid sequence and Cricoid Pressure applied Laryngoscope Size: Mac and 4 (cricoid for visualization) Grade View: Grade II Tube type: Oral Number of attempts: 1 Airway Equipment and Method: Stylet and Bite block Placement Confirmation: positive ETCO2,  ETT inserted through vocal cords under direct vision and breath sounds checked- equal and bilateral Secured at: 23 cm Tube secured with: Tape Dental Injury: Teeth and Oropharynx as per pre-operative assessment  Future Recommendations: Recommend- induction with short-acting agent, and alternative techniques readily available

## 2012-12-22 NOTE — Plan of Care (Signed)
Problem: Consults Goal: Diagnosis- Total Joint Replacement Primary Total Knee     

## 2012-12-22 NOTE — Anesthesia Postprocedure Evaluation (Signed)
Anesthesia Post Note  Patient: Marcus Turner  Procedure(s) Performed: Procedure(s) (LRB): TOTAL KNEE ARTHROPLASTY- left (Left)  Anesthesia type: General  Patient location: PACU  Post pain: Pain level controlled and Adequate analgesia  Post assessment: Post-op Vital signs reviewed, Patient's Cardiovascular Status Stable, Respiratory Function Stable, Patent Airway and Pain level controlled  Last Vitals:  Filed Vitals:   12/22/12 1153  BP:   Pulse:   Temp: 36.4 C  Resp:     Post vital signs: Reviewed and stable  Level of consciousness: awake, alert  and oriented  Complications: No apparent anesthesia complications

## 2012-12-22 NOTE — Evaluation (Signed)
Physical Therapy Evaluation Patient Details Name: Marcus Turner MRN: [id number] DOB: [birth year] Today's Date: 12/22/2012 Time: 4098-1191 PT Time Calculation (min): 25 min  PT Assessment / Plan / Recommendation History of Present Illness  Patient is a 48 y/o s/p left TKA due to OA and work injury.  Clinical Impression  Patient presents with decreased independence with mobility due to acute pain, limited activity tolerance, decreased AROM and strength left LE and will benefit from skilled PT in the acute setting to maximize independence and allow return home with family assist.    PT Assessment  Patient needs continued PT services    Follow Up Recommendations  Home health PT;Supervision/Assistance - 24 hour    Does the patient have the potential to tolerate intense rehabilitation    N/A  Barriers to Discharge   None       Equipment Recommendations  Rolling walker with 5" wheels    Recommendations for Other Services   None  Frequency 7X/week    Precautions / Restrictions Precautions Precautions: Knee Restrictions LLE Weight Bearing: Weight bearing as tolerated   Pertinent Vitals/Pain C/o pain left knee prior to tx RN aware and medicated; HR 100, SpO2 92 after OOB on room air (O2 replaced at rest)      Mobility  Bed Mobility Bed Mobility: Supine to Sit Supine to Sit: 4: Min assist;HOB elevated;With rails Details for Bed Mobility Assistance: cues for technique and why need to practice without raising head of bed further Transfers Transfers: Sit to Stand;Stand to Sit Sit to Stand: 4: Min assist;From bed Stand to Sit: 4: Min assist;With armrests;To chair/3-in-1 Details for Transfer Assistance: cues for technique and hand placement Ambulation/Gait Ambulation/Gait Assistance: 4: Min guard Ambulation Distance (Feet): 4 Feet Assistive device: Rolling walker Ambulation/Gait Assistance Details: to chair; cues for technique and weight bearing; min c/o light headedness  therefore did not walk further Gait Pattern: Decreased step length - right;Step-to pattern;Antalgic    Exercises Total Joint Exercises Ankle Circles/Pumps: AROM;Both;5 reps;Supine Quad Sets: AROM;Left;5 reps;Supine Heel Slides: AAROM;Left;5 reps;Supine   PT Diagnosis: Acute pain;Difficulty walking  PT Problem List: Decreased strength;Decreased range of motion;Decreased knowledge of use of DME;Decreased activity tolerance;Decreased mobility;Pain PT Treatment Interventions: DME instruction;Gait training;Stair training;Functional mobility training;Therapeutic activities;Therapeutic exercise;Patient/family education     PT Goals(Current goals can be found in the care plan section) Acute Rehab PT Goals Patient Stated Goal: To get back to work PT Goal Formulation: With patient/family Time For Goal Achievement: 12/29/12 Potential to Achieve Goals: Good  Visit Information  Last PT Received On: 12/22/12 Assistance Needed: +1 History of Present Illness: Patient is a 47 y/o s/p left TKA due to OA and work injury.       Prior Functioning  Home Living Family/patient expects to be discharged to:: Private residence Living Arrangements: Spouse/significant other Available Help at Discharge: Family Type of Home: House Home Access: Stairs to enter Secretary/administrator of Steps: 1 or 2 Home Layout: One level Home Equipment: None Prior Function Level of Independence: Independent;Independent with assistive device(s) Comments: occasionally used cane (wore out) Communication Communication: No difficulties Dominant Hand: Right    Cognition  Cognition Arousal/Alertness: Awake/alert Behavior During Therapy: WFL for tasks assessed/performed Overall Cognitive Status: Within Functional Limits for tasks assessed    Extremity/Trunk Assessment Upper Extremity Assessment Upper Extremity Assessment: Defer to OT evaluation Lower Extremity Assessment Lower Extremity Assessment: RLE  deficits/detail;LLE deficits/detail RLE Deficits / Details: WFL AROM and strength LLE Deficits / Details: ankle AROM WFL, positive quad set, AAROM knee  flexion about 30 degrees   Balance    End of Session PT - End of Session Equipment Utilized During Treatment: Gait belt Activity Tolerance: Patient limited by fatigue (lightheaded) Patient left: in chair;with call bell/phone within reach;with family/visitor present CPM Left Knee CPM Left Knee: Off Left Knee Flexion (Degrees): 60 Left Knee Extension (Degrees): 0 Additional Comments: completed 3 hours  GP     Bradie Sangiovanni,CYNDI 12/22/2012, 5:07 PM Sheran Lawless, PT [phone] 12/22/2012

## 2012-12-22 NOTE — Interval H&P Note (Signed)
History and Physical Interval Note:  12/22/2012 7:22 AM  Marcus Turner  has presented today for surgery, with the diagnosis of OSTEOARTHRITIS LEFT KNEE  The various methods of treatment have been discussed with the patient and family. After consideration of risks, benefits and other options for treatment, the patient has consented to  Procedure(s): TOTAL KNEE ARTHROPLASTY (Left) as a surgical intervention .  The patient's history has been reviewed, patient examined, no change in status, stable for surgery.  I have reviewed the patient's chart and labs.  Questions were answered to the patient's satisfaction.     Nestor Lewandowsky

## 2012-12-22 NOTE — Preoperative (Signed)
Beta Blockers   Reason not to administer Beta Blockers:Not Applicable 

## 2012-12-23 ENCOUNTER — Encounter (HOSPITAL_COMMUNITY): Payer: Self-pay | Admitting: Orthopedic Surgery

## 2012-12-23 LAB — CBC
HCT: 38.4 % — ABNORMAL LOW (ref 39.0–52.0)
MCHC: 35.4 g/dL (ref 30.0–36.0)
Platelets: 259 10*3/uL (ref 150–400)
RDW: 14.4 % (ref 11.5–15.5)
WBC: 9 10*3/uL (ref 4.0–10.5)

## 2012-12-23 MED ORDER — ASPIRIN EC 325 MG PO TBEC: 325.0000 mg | DELAYED_RELEASE_TABLET | Freq: Two times a day (BID) | ORAL | Status: AC

## 2012-12-23 MED ORDER — METHOCARBAMOL 500 MG PO TABS: 500.0000 mg | ORAL_TABLET | Freq: Four times a day (QID) | ORAL | Status: AC

## 2012-12-23 MED ORDER — OXYCODONE-ACETAMINOPHEN 5-325 MG PO TABS: 1.0000 | ORAL_TABLET | ORAL | Status: AC | PRN

## 2012-12-23 NOTE — Progress Notes (Signed)
Orthopedic Tech Progress Note Patient Details:  MARQUAVIOUS NAZAR 11-May-1966 [id number] On cpm at 6:15 pm LLE 0-45. Patient ID: HALSEY HAMMEN, male   DOB: [birth year], 47 y.o.   MRN: [id number]   Jennye Moccasin 12/23/2012, 6:13 PM

## 2012-12-23 NOTE — Progress Notes (Signed)
Physical Therapy Treatment Patient Details Name: Marcus Turner MRN: [id number] DOB: 1965-09-24 Today's Date: 12/23/2012 Time: 4098-1191 PT Time Calculation (min): 39 min  PT Assessment / Plan / Recommendation  History of Present Illness Patient is a 47 y/o s/p left TKA due to OA and work injury.   PT Comments   Patient progressing well. Able to increase ambulation tolerance and distance. Anticipate DC tomorrow after stair training.   Follow Up Recommendations  Home health PT;Supervision/Assistance - 24 hour     Does the patient have the potential to tolerate intense rehabilitation     Barriers to Discharge        Equipment Recommendations  Rolling walker with 5" wheels    Recommendations for Other Services    Frequency 7X/week   Progress towards PT Goals Progress towards PT goals: Progressing toward goals  Plan Current plan remains appropriate    Precautions / Restrictions Precautions Precautions: Knee Restrictions LLE Weight Bearing: Weight bearing as tolerated   Pertinent Vitals/Pain 9/10 L LE. RN provided medication to assist with pain control     Mobility  Bed Mobility Supine to Sit: 4: Min assist;HOB elevated;With rails Details for Bed Mobility Assistance: A for LE.  Transfers Sit to Stand: From bed;4: Min guard Stand to Sit: With armrests;To chair/3-in-1;4: Min guard Details for Transfer Assistance: cues for technique and hand placement Ambulation/Gait Ambulation/Gait Assistance: 4: Min guard Ambulation Distance (Feet): 250 Feet Assistive device: Rolling walker Ambulation/Gait Assistance Details: Patient ambulating better this afternoon. Increasing weight through LEs.  Gait Pattern: Step-to pattern;Step-through pattern;Decreased stride length Gait velocity: increasing    Exercises Total Joint Exercises Quad Sets: AROM;Left;10 reps Heel Slides: AAROM;Left;10 reps Hip ABduction/ADduction: AAROM;Left;10 reps Straight Leg Raises: AAROM;Left;10 reps   PT  Diagnosis:    PT Problem List:   PT Treatment Interventions:     PT Goals (current goals can now be found in the care plan section)    Visit Information  Last PT Received On: 12/23/12 Assistance Needed: +1 History of Present Illness: Patient is a 47 y/o s/p left TKA due to OA and work injury.    Subjective Data      Cognition  Cognition Arousal/Alertness: Awake/alert Behavior During Therapy: WFL for tasks assessed/performed Overall Cognitive Status: Within Functional Limits for tasks assessed    Balance     End of Session PT - End of Session Equipment Utilized During Treatment: Gait belt Activity Tolerance: Patient tolerated treatment well Patient left: in chair;with call bell/phone within reach;with family/visitor present   GP     Robinette, Adline Potter 12/23/2012, 2:26 PM 12/23/2012 Fredrich Birks PTA [phone] pager [phone] office

## 2012-12-23 NOTE — Progress Notes (Signed)
   CARE MANAGEMENT NOTE 12/23/2012  Patient:  Marcus Turner   Account Number:  [id number]  Date Initiated:  12/23/2012  Documentation initiated by:  [facility]  Subjective/Objective Assessment:   TOTAL KNEE ARTHROPLASTY- left     Action/Plan:   HH   Anticipated DC Date:  12/24/2012   Anticipated DC Plan:  HOME W HOME HEALTH SERVICES      DC Planning Services  CM consult      Choice offered to / List presented to:     DME arranged  3-N-1  CPM  [facility]  [facility]      DME agency  [employer]        Status of service:  In process, will continue to follow Medicare Important Message given?   (If response is "NO", the following Medicare IM given date fields will be blank) Date Medicare IM given:   Date Additional Medicare IM given:    Discharge Disposition:    Per UR Regulation:    If discussed at Long Length of Stay Meetings, dates discussed:    Comments:  12/23/2012 1700 NCM spoke to pt and provided information on [employer], [place] # [phone]. Left message on rep's voicemail for return call. [employer] will deliver DME to pt at time of dc. Called rep for instruction to fax dc summary and orders for [facility]. Scheduled dc on 8/6. Isidoro Donning RN CCM Case Mgmt phone [phone]

## 2012-12-23 NOTE — Progress Notes (Signed)
Patient ID: Marcus Turner, male   DOB: 11/15/1965, 47 y.o.   MRN: [id number] PATIENT ID: Marcus Turner  MRN: [id number]  DOB/AGE:  Oct 28, 1965 / 47 y.o.  1 Day Post-Op Procedure(s) (LRB): TOTAL KNEE ARTHROPLASTY- left (Left)    PROGRESS NOTE Subjective: Patient is alert, oriented, no Nausea, no Vomiting, no passing gas, no Bowel Movement. Taking PO sip. Denies SOB, Chest or Calf Pain. Using Incentive Spirometer, PAS in place. Ambulate walked in room yesterday weightbearing as tolerated, CPM 0-60 Patient reports pain as 6 on 0-10 scale  .    Objective: Vital signs in last 24 hours: Filed Vitals:   12/22/12 2100 12/23/12 0200 12/23/12 0545 12/23/12 0657  BP: 140/89 158/100 151/28 158/100  Pulse: 88 112 104   Temp: 98.2 F (36.8 C) 98.2 F (36.8 C) 98.9 F (37.2 C)   TempSrc:      Resp: 18 18 18    SpO2: 95% 98% 93%       Intake/Output from previous day: I/O last 3 completed shifts: In: 4401.7 [I.V.:4291.7; IV Piggyback:110] Out: 4725 [Urine:4250; Drains:475]   Intake/Output this shift:     LABORATORY DATA:  Recent Labs  12/23/12 0528  WBC 9.0  HGB 13.6  HCT 38.4*  PLT 259    Examination: Neurologically intact ABD soft Neurovascular intact Sensation intact distally Intact pulses distally Dorsiflexion/Plantar flexion intact Incision: no drainage No cellulitis present Compartment soft 2+ abdominal gas to percussion Blood and plasma separated in drain indicating minimal recent drainage, drain pulled without difficulty.   Assessment:   1 Day Post-Op Procedure(s) (LRB): TOTAL KNEE ARTHROPLASTY- left (Left) ADDITIONAL DIAGNOSIS:  Hypertension, abdominal distention secondary to gas  Plan: PT/OT WBAT, CPM 5/hrs day until ROM 0-90 degrees, then D/C CPM, encourage sitting up in ambulation to help with abdominal gas. DVT Prophylaxis:  SCDx72hrs, ASA 325 mg BID x 2 weeks DISCHARGE PLAN: Home DISCHARGE NEEDS: HHPT, HHRN, CPM, Walker and 3-in-1 comode seat      Adrain Butrick J 12/23/2012, 7:18 AM

## 2012-12-23 NOTE — Progress Notes (Signed)
Physical Therapy Treatment Patient Details Name: Marcus Turner MRN: [id number] DOB: 05-Mar-1966 Today's Date: 12/23/2012 Time: 0812-0840 PT Time Calculation (min): 28 min  PT Assessment / Plan / Recommendation  History of Present Illness Patient is a 47 y/o s/p left TKA due to OA and work injury.   PT Comments   Patient progressing well. Will increase ambulation this afternoon. Patient is making good progress with PT.  From a mobility standpoint anticipate patient will be ready for DC home tomorrow .        Follow Up Recommendations  Home health PT;Supervision/Assistance - 24 hour     Does the patient have the potential to tolerate intense rehabilitation     Barriers to Discharge        Equipment Recommendations  Rolling walker with 5" wheels    Recommendations for Other Services    Frequency 7X/week   Progress towards PT Goals Progress towards PT goals: Progressing toward goals  Plan Current plan remains appropriate    Precautions / Restrictions Precautions Precautions: Knee Restrictions LLE Weight Bearing: Weight bearing as tolerated   Pertinent Vitals/Pain 6/10 knee pain. patient repositioned for comfort     Mobility  Bed Mobility Supine to Sit: 4: Min assist;HOB elevated;With rails Details for Bed Mobility Assistance: A for LE.  Transfers Sit to Stand: 4: Min assist;From bed Stand to Sit: With armrests;To chair/3-in-1;4: Min assist Details for Transfer Assistance: cues for technique and hand placement Ambulation/Gait Ambulation/Gait Assistance: 4: Min guard Ambulation Distance (Feet): 45 Feet Assistive device: Rolling walker Ambulation/Gait Assistance Details: Cues for gait sequence and WBAT Gait Pattern: Decreased step length - right;Step-to pattern Gait velocity: decreased    Exercises Total Joint Exercises Quad Sets: AROM;Left;10 reps Heel Slides: AAROM;Left;10 reps Hip ABduction/ADduction: AAROM;Left;10 reps Straight Leg Raises: AAROM;Left;10 reps    PT Diagnosis:    PT Problem List:   PT Treatment Interventions:     PT Goals (current goals can now be found in the care plan section)    Visit Information  Last PT Received On: 12/23/12 Assistance Needed: +1 History of Present Illness: Patient is a 47 y/o s/p left TKA due to OA and work injury.    Subjective Data      Cognition  Cognition Arousal/Alertness: Awake/alert Behavior During Therapy: WFL for tasks assessed/performed Overall Cognitive Status: Within Functional Limits for tasks assessed    Balance     End of Session PT - End of Session Equipment Utilized During Treatment: Gait belt Activity Tolerance: Patient tolerated treatment well Patient left: in chair;with call bell/phone within reach;with family/visitor present   GP     Fredrich Birks 12/23/2012, 8:50 AM  12/23/2012 Fredrich Birks PTA [phone] pager [phone] office

## 2012-12-24 LAB — CBC
Hemoglobin: 12.8 g/dL — ABNORMAL LOW (ref 13.0–17.0)
MCH: 27.7 pg (ref 26.0–34.0)
Platelets: 249 10*3/uL (ref 150–400)
RBC: 4.62 MIL/uL (ref 4.22–5.81)

## 2012-12-24 NOTE — Progress Notes (Signed)
PATIENT ID: Marcus Turner  MRN: [id number]  DOB/AGE:  [birth year] / 47 y.o.  2 Days Post-Op Procedure(s) (LRB): TOTAL KNEE ARTHROPLASTY- left (Left)    PROGRESS NOTE Subjective: Patient is alert, oriented, no Nausea, no Vomiting, yes passing gas, no Bowel Movement. Taking PO well. Denies SOB, Chest or Calf Pain. Using Incentive Spirometer, PAS in place. Ambulate WBAT, pt walked 200 feet yesterday, CPM 0-45 Patient reports pain as mild  .    Objective: Vital signs in last 24 hours: Filed Vitals:   12/23/12 2059 12/24/12 0000 12/24/12 0400 12/24/12 0533  BP: 142/81   152/98  Pulse: 100   10  Temp: 99 F (37.2 C)   98.4 F (36.9 C)  TempSrc: Oral     Resp: 20 20 20 18   SpO2: 94% 94% 94% 98%      Intake/Output from previous day: I/O last 3 completed shifts: In: 2541.7 [P.O.:1000; I.V.:1541.7] Out: 5150 [Urine:5000; Drains:150]   Intake/Output this shift:     LABORATORY DATA:  Recent Labs  12/23/12 0528 12/24/12 0500  WBC 9.0 10.7*  HGB 13.6 12.8*  HCT 38.4* 37.5*  PLT 259 249    Examination: Neurologically intact ABD soft Neurovascular intact Sensation intact distally Intact pulses distally Dorsiflexion/Plantar flexion intact Incision: no drainage Compartment soft}  Assessment:   2 Days Post-Op Procedure(s) (LRB): TOTAL KNEE ARTHROPLASTY- left (Left) ADDITIONAL DIAGNOSIS:  Hypertension  Plan: PT/OT WBAT, CPM 5/hrs day until ROM 0-90 degrees, then D/C CPM DVT Prophylaxis:  SCDx72hrs, ASA 325 mg BID x 2 weeks DISCHARGE PLAN: Home when pt passes PT. DISCHARGE NEEDS: HHPT, HHRN, CPM, Walker and 3-in-1 comode seat     Marcus Turner R 12/24/2012, 7:29 AM

## 2012-12-24 NOTE — Discharge Summary (Signed)
Patient ID: Marcus Turner MRN: [id number] DOB/AGE: 48-Apr-1967 48 y.o.  Admit date: 12/22/2012 Discharge date: 12/24/2012  Admission Diagnoses:  Principal Problem:   Osteoarthritis of left knee   Discharge Diagnoses:  Same  Past Medical History  Diagnosis Date  . PONV (postoperative nausea and vomiting)     some nausea...no vomiting  . Hypertension   . H/O hiatal hernia     Surgeries: Procedure(s): TOTAL KNEE ARTHROPLASTY- left on 12/22/2012   Consultants:    Discharged Condition: Improved  Hospital Course: LOVEL SUAZO is an 47 y.o. male who was admitted 12/22/2012 for operative treatment ofOsteoarthritis of left knee. Patient has severe unremitting pain that affects sleep, daily activities, and work/hobbies. After pre-op clearance the patient was taken to the operating room on 12/22/2012 and underwent  Procedure(s): TOTAL KNEE ARTHROPLASTY- left.    Patient was given perioperative antibiotics: Anti-infectives   Start     Dose/Rate Route Frequency Ordered Stop   12/22/12 0849  cefUROXime (ZINACEF) injection  Status:  Discontinued       As needed 12/22/12 0849 12/22/12 0954   12/22/12 0600  ceFAZolin (ANCEF) IVPB 2 g/50 mL premix     2 g 100 mL/hr over 30 Minutes Intravenous On call to O.R. 12/21/12 1331 12/22/12 0740       Patient was given sequential compression devices, early ambulation, and chemoprophylaxis to prevent DVT.  Patient benefited maximally from hospital stay and there were no complications.    Recent vital signs: Patient Vitals for the past 24 hrs:  BP Temp Temp src Pulse Resp SpO2  12/24/12 0533 152/98 mmHg 98.4 F (36.9 C) - 10 18 98 %  12/24/12 0400 - - - - 20 94 %  12/24/12 0000 - - - - 20 94 %  12/23/12 2059 142/81 mmHg 99 F (37.2 C) Oral 100 20 94 %  12/23/12 2000 - - - - 20 94 %  12/23/12 1600 - - - - 18 95 %  12/23/12 1310 138/91 mmHg 100 F (37.8 C) - 107 20 93 %  12/23/12 1200 - - - - 20 96 %  12/23/12 0800 - - - - 18 95 %     Recent  laboratory studies:  Recent Labs  12/23/12 0528 12/24/12 0500  WBC 9.0 10.7*  HGB 13.6 12.8*  HCT 38.4* 37.5*  PLT 259 249     Discharge Medications:     Medication List    STOP taking these medications       ibuprofen 800 MG tablet  Commonly known as:  ADVIL,MOTRIN      TAKE these medications       amLODipine 5 MG tablet  Commonly known as:  NORVASC  Take 5 mg by mouth daily.     aspirin EC 325 MG tablet  Take 1 tablet (325 mg total) by mouth 2 (two) times daily.     lisinopril 5 MG tablet  Commonly known as:  PRINIVIL,ZESTRIL  Take 5 mg by mouth daily.     methocarbamol 500 MG tablet  Commonly known as:  ROBAXIN  Take 1 tablet (500 mg total) by mouth 4 (four) times daily.     oxyCODONE-acetaminophen 5-325 MG per tablet  Commonly known as:  ROXICET  Take 1-2 tablets by mouth every 4 (four) hours as needed for pain.        Diagnostic Studies: Dg Chest 2 View  12/19/2012   *RADIOLOGY REPORT*  Clinical Data: Preoperative respiratory exam.  Severe osteoarthritis of the  left knee.  CHEST - 2 VIEW  Comparison: None.  Findings: The heart size and pulmonary vascularity are normal and the lungs are clear.  No osseous abnormality.  IMPRESSION: Normal chest.   Original Report Authenticated By: Francene Boyers, M.D.    Disposition: Final discharge disposition not confirmed      Discharge Orders   Future Orders Complete By Expires     CPM  As directed     Comments:      Continuous passive motion machine (CPM):      Use the CPM from 0 to 60  for 5 hours per day.      You may increase by 10 degrees per day.  You may break it up into 2 or 3 sessions per day.      Use CPM for 2 weeks or until you are told to stop.    Call MD / Call 911  As directed     Comments:      If you experience chest pain or shortness of breath, CALL 911 and be transported to the hospital emergency room.  If you develope a fever above 101 F, pus (white drainage) or increased drainage or redness at  the wound, or calf pain, call your surgeon's office.    Change dressing  As directed     Comments:      Change dressing on 5, then change the dressing daily with sterile 4 x 4 inch gauze dressing and apply TED hose.  You may clean the incision with alcohol prior to redressing.    Constipation Prevention  As directed     Comments:      Drink plenty of fluids.  Prune juice may be helpful.  You may use a stool softener, such as Colace (over the counter) 100 mg twice a day.  Use MiraLax (over the counter) for constipation as needed.    Diet - low sodium heart healthy  As directed     Discharge instructions  As directed     Comments:      Follow up in 2 weeks in office with Dr. Turner Daniels.    Driving restrictions  As directed     Comments:      No driving for 2 weeks    Increase activity slowly as tolerated  As directed     Patient may shower  As directed     Comments:      You may shower without a dressing once there is no drainage.  Do not wash over the wound.  If drainage remains, cover wound with plastic wrap and then shower.       Follow-up Information   Follow up with Nestor Lewandowsky, MD. Schedule an appointment as soon as possible for a visit in 2 weeks.   Contact information:   [address] [place] [address] [phone]        Signed: Vear Clock Fabien Travelstead R 12/24/2012, 7:36 AM

## 2012-12-24 NOTE — Progress Notes (Signed)
Discharge instructions given to patient and wife. Prescription for pain medication given. Other meds already called into pharmacy for pickup. [employer] will be delivering equipment to the home and will show them how to use the CPM machine. Patient will be having outpatient PT/OT in [place].

## 2012-12-24 NOTE — Evaluation (Signed)
Occupational Therapy Evaluation Patient Details Name: Marcus Turner MRN: [id number] DOB: [birth year] Today's Date: 12/24/2012 Time: 4098-1191 OT Time Calculation (min): 35 min  OT Assessment / Plan / Recommendation History of present illness Patient is a 47 y/o s/p left TKA due to OA and work injury.   Clinical Impression    Patient presents with below problem list (see OT problem list). Pt will benefit from acute OT to increase independence and safety prior to d/c home.    OT Assessment  Patient needs continued OT Services    Follow Up Recommendations  Home health OT;Supervision/Assistance - 24 hour    Barriers to Discharge      Equipment Recommendations  Other (comment) (pt receiving tub bench, 3 in 1, and AE)    Recommendations for Other Services    Frequency  Min 2X/week    Precautions / Restrictions Precautions Precautions: Knee Restrictions Weight Bearing Restrictions: Yes LLE Weight Bearing: Weight bearing as tolerated   Pertinent Vitals/Pain Pain 20/10. Repositioned. Pt pushed call bell and called for meds.    ADL  Eating/Feeding: Independent Where Assessed - Eating/Feeding: Chair Grooming: Set up Where Assessed - Grooming: Supported sitting Upper Body Bathing: Set up Where Assessed - Upper Body Bathing: Supported sitting Lower Body Bathing: Minimal assistance Where Assessed - Lower Body Bathing: Supported sit to stand Upper Body Dressing: Set up Where Assessed - Upper Body Dressing: Supported sitting Lower Body Dressing: Minimal assistance Where Assessed - Lower Body Dressing: Supported sit to Pharmacist, hospital: Hydrographic surveyor Method: Sit to Barista: Animator Transfer: Performed;Minimal assistance Tub/Shower Transfer Method: Science writer: Information systems manager with back Equipment Used: Gait belt;Rolling walker;Reacher;Long-handled sponge;Long-handled shoe horn;Sock  aid Transfers/Ambulation Related to ADLs: [place] ADL Comments: Pt practiced tub transfer with shower chair initially and at Min A level to help LLE-cues for technique. OT informed later that pt was receiving tub bench, so OT educated on tub transfer bench transfer technique. Pt unable to reach Lt foot to don/doff sock. OT explained benefit of trying to reach Left foot as it increases ROM in knee. Pt able to don/doff sock with reacher and sockaid. Pt already had shorts on, so OT educated how to use reacher to don/doff shorts/underwear and correct dressing technique. Pt overall Min A level for LB ADLs. OT educated to have bed/chair behind him when pulling up pants/underwear with walker in front with family with him. Cues for hand placement as pt pulled up from walker from 3 in 1.    OT Diagnosis: Acute pain  OT Problem List: Decreased strength;Decreased range of motion;Decreased activity tolerance;Impaired balance (sitting and/or standing);Decreased knowledge of use of DME or AE;Decreased knowledge of precautions;Pain;Decreased safety awareness OT Treatment Interventions: Self-care/ADL training;DME and/or AE instruction;Therapeutic activities;Patient/family education;Balance training   OT Goals(Current goals can be found in the care plan section) Acute Rehab OT Goals Patient Stated Goal: go home OT Goal Formulation: With patient Time For Goal Achievement: 12/31/12 Potential to Achieve Goals: Good ADL Goals Pt Will Perform Lower Body Bathing: with modified independence;with adaptive equipment;sit to/from stand Pt Will Perform Lower Body Dressing: with modified independence;with adaptive equipment;sit to/from stand Pt Will Transfer to Toilet: with modified independence;ambulating (3 in 1 over commode) Pt Will Perform Toileting - Clothing Manipulation and hygiene: with modified independence;sit to/from stand Pt Will Perform Tub/Shower Transfer: Tub transfer;with supervision;tub bench  Visit  Information  Last OT Received On: 12/24/12 Assistance Needed: +1 History of Present Illness: Patient is a 47  y/o s/p left TKA due to OA and work injury.       Prior Functioning     Home Living Family/patient expects to be discharged to:: Private residence Living Arrangements: Spouse/significant other Available Help at Discharge: Family Type of Home: House Home Access: Stairs to enter [employer] of Steps: 1 or 2 Home Equipment: None Prior Function Level of Independence: Independent;Independent with assistive device(s) Comments: occasionally used cane (wore out) Communication Communication: No difficulties Dominant Hand: Right         Vision/Perception     Cognition  Cognition Arousal/Alertness: Awake/alert Behavior During Therapy: WFL for tasks assessed/performed Overall Cognitive Status: Within Functional Limits for tasks assessed    Extremity/Trunk Assessment Upper Extremity Assessment Upper Extremity Assessment: Overall WFL for tasks assessed     Mobility Bed Mobility Bed Mobility: Not assessed Transfers Transfers: Sit to Stand;Stand to Sit Sit to Stand: 4: Min guard;With upper extremity assist;From chair/3-in-1 Stand to Sit: 4: Min guard;With upper extremity assist;To chair/3-in-1 Details for Transfer Assistance: cues for hand placement and technique     Exercise     Balance     End of Session OT - End of Session Equipment Utilized During Treatment: Gait belt;Rolling walker Activity Tolerance: Patient tolerated treatment well Patient left: in chair;with family/visitor present Nurse Communication: Other (comment) (make sure has HHOT set up) CPM Right Knee CPM Right Knee: Off  GO     Earlie Raveling OTR/L [phone] 12/24/2012, 10:38 AM

## 2012-12-24 NOTE — Progress Notes (Signed)
Physical Therapy Treatment Patient Details Name: Marcus Turner MRN: [id number] DOB: [birth year] Today's Date: 12/24/2012 Time: 0850-0919 PT Time Calculation (min): 29 min  PT Assessment / Plan / Recommendation  History of Present Illness Patient is a 47 y/o s/p left TKA due to OA and work injury.   PT Comments   Patient progressing well. Able to tolerate stair training. Anticipate DC this morning  Follow Up Recommendations  Home health PT;Supervision/Assistance - 24 hour     Does the patient have the potential to tolerate intense rehabilitation     Barriers to Discharge        Equipment Recommendations  Rolling walker with 5" wheels    Recommendations for Other Services    Frequency 7X/week   Progress towards PT Goals Progress towards PT goals: Progressing toward goals  Plan Current plan remains appropriate    Precautions / Restrictions Precautions Precautions: Knee Restrictions LLE Weight Bearing: Weight bearing as tolerated   Pertinent Vitals/Pain 6/10 L knee pain. RN provided medication to assist with pain control     Mobility  Bed Mobility Bed Mobility: Not assessed Transfers Sit to Stand: 4: Min guard;With armrests;From chair/3-in-1 Stand to Sit: 4: Min guard;With armrests;To chair/3-in-1 Details for Transfer Assistance: cues for technique and hand placement Ambulation/Gait Ambulation/Gait Assistance: 5: Supervision Ambulation Distance (Feet): 250 Feet Assistive device: Rolling walker Ambulation/Gait Assistance Details: Cues for posture and to attempt step through gait pattern Gait Pattern: Step-through pattern;Step-to pattern Stairs: Yes Stairs Assistance: 4: Min guard Stairs Assistance Details (indicate cue type and reason): Cues for seqency and technique. Practice twice Stair Management Technique: No rails;Step to pattern;Forwards;With walker Number of Stairs: 1    Exercises     PT Diagnosis:    PT Problem List:   PT Treatment Interventions:     PT  Goals (current goals can now be found in the care plan section)    Visit Information  Last PT Received On: 12/24/12 Assistance Needed: +1 History of Present Illness: Patient is a 47 y/o s/p left TKA due to OA and work injury.    Subjective Data      Cognition  Cognition Arousal/Alertness: Awake/alert Behavior During Therapy: WFL for tasks assessed/performed Overall Cognitive Status: Within Functional Limits for tasks assessed    Balance     End of Session PT - End of Session Equipment Utilized During Treatment: Gait belt Activity Tolerance: Patient tolerated treatment well Patient left: in chair;with call bell/phone within reach;with family/visitor present   GP     Fredrich Birks 12/24/2012, 9:26 AM 12/24/2012 Fredrich Birks PTA [phone] pager [phone] office

## 2012-12-24 NOTE — Progress Notes (Signed)
   CARE MANAGEMENT NOTE 12/24/2012  Patient:  Marcus, Turner   Account Number:  [id number]  Date Initiated:  12/23/2012  Documentation initiated by:  [facility]  Subjective/Objective Assessment:   TOTAL KNEE ARTHROPLASTY- left     Action/Plan:   HH   Anticipated DC Date:  12/24/2012   Anticipated DC Plan:  HOME W HOME HEALTH SERVICES      DC Planning Services  CM consult  Outpatient Services - Pt will follow up      Choice offered to / List presented to:     DME arranged  3-N-1  CPM  WALKER - ROLLING  CANE      DME agency  [employer]        Status of service:  Completed, signed off Medicare Important Message given?   (If response is "NO", the following Medicare IM given date fields will be blank) Date Medicare IM given:   Date Additional Medicare IM given:    Discharge Disposition:  HOME/SELF CARE  Per UR Regulation:    If discussed at Long Length of Stay Meetings, dates discussed:    Comments:  12-24-12 11:00 Met with pt who stated he was going to have out pt therapy with [facility] and Worker's Comp will provide transportation to and from therapy; faxed Discharge Summary to [place] for [employer], [phone].  [employer] providing CPM and instruction at home to wife of patient. Freddy Jaksch, BSN, Caryl Ada [phone].  12/23/2012 1700 NCM spoke to pt and provided information on [employer], [place] # [phone]. Left message on rep's voicemail for return call. [employer] will deliver DME to pt at time of dc. Called rep for instruction to fax dc summary and orders for [facility] Scheduled dc on 8/6. Isidoro Donning RN CCM Case Mgmt phone [phone]

## 2013-05-06 ENCOUNTER — Other Ambulatory Visit: Payer: Self-pay | Admitting: Orthopedic Surgery

## 2013-05-19 NOTE — Pre-Procedure Instructions (Signed)
Marcus Turner  05/19/2013   Your procedure is scheduled on:  Monday, January 5.  Report to [facility], Main Entrance [facility] "A" at 7:00AM.  Call this number if you have problems the morning of surgery: [phone]   Remember:   Do not eat food or drink liquids after midnight, Sunday.   Take these medicines the morning of surgery with A SIP OF WATER: amLODipine (NORVASC).  Take if needed:methocarbamol (ROBAXIN),oxyCODONE-acetaminophen (ROXICET).     Do not wear jewelry, make-up or nail polish.  Do not wear lotions, powders, or perfumes. You may wear deodorant.   Men may shave face and neck.  Do not bring valuables to the hospital.  [facility] is not responsiblefor any belongings or valuables.               Contacts, dentures or bridgework may not be worn into surgery.  Leave suitcase in the car. After surgery it may be brought to your room.  For patients admitted to the hospital, discharge time is determined by yourtreatment team.               Patients discharged the day of surgery will not be allowed to drive home.  Name and phone number of your driver:    Special Instructions: Shower using CHG 2 nights before surgery and the night before surgery.  If you shower the day of surgery use CHG.  Use special wash - you have one bottle of CHG for all showers.  You should use approximately 1/3 of the bottle for each shower.   Please read over the following fact sheets that you were given: Pain Booklet, Coughing and Deep Breathing, Blood Transfusion Information and Surgical Site Infection Prevention

## 2013-05-19 NOTE — H&P (Signed)
TOTAL KNEE ADMISSION H&P  Patient is being admitted for right total knee arthroplasty.  Subjective:  Chief Complaint:right knee pain.  HPI: Marcus Turner, 47 y.o. male, has a history of pain and functional disability in the right knee due to arthritis and has failed non-surgical conservative treatments for greater than 12 weeks to includeNSAID's and/or analgesics, corticosteriod injections and activity modification.  Onset of symptoms was gradual, starting many years ago with gradually worsening course since that time. The patient noted no past surgery on the right knee(s).  Patient currently rates pain in the right knee(s) at 10 out of 10 with activity. Patient has night pain, worsening of pain with activity and weight bearing, pain that interferes with activities of daily living, pain with passive range of motion and joint swelling.  Patient has evidence of joint space narrowing by imaging studies. . There is no active infection.  Patient Active Problem List   Diagnosis Date Noted  . Osteoarthritis of left knee 12/21/2012   Past Medical History  Diagnosis Date  . PONV (postoperative nausea and vomiting)     some nausea...no vomiting  . Hypertension   . H/O hiatal hernia     Past Surgical History  Procedure Laterality Date  . Knee arthroscopy w/ meniscal repair      right  . Hernia repair      ventral  . Total knee arthroplasty Left 12/22/2012    Procedure: TOTAL KNEE ARTHROPLASTY- left;  Surgeon: Nestor Lewandowsky, MD;  Location: [facility] OR;  Service: Orthopedics;  Laterality: Left;    No prescriptions prior to admission   Allergies  Allergen Reactions  . Tramadol Nausea Only    History  Substance Use Topics  . Smoking status: Former Smoker -- 1.00 packs/day for 4 years  . Smokeless tobacco: Not on file  . Alcohol Use: Yes     Comment: occasionally    No family history on file.   Review of Systems  Constitutional: Negative.   HENT: Negative.   Eyes: Negative.   Respiratory:  Negative.   Cardiovascular: Negative.   Gastrointestinal: Negative.   Genitourinary: Negative.   Musculoskeletal: Positive for joint pain.  Skin: Negative.   Neurological: Negative.   Endo/Heme/Allergies: Negative.   Psychiatric/Behavioral: Negative.     Objective:  Physical Exam  Constitutional: He is oriented to person, place, and time. He appears well-developed and well-nourished.  HENT:  Head: Normocephalic and atraumatic.  Eyes: Pupils are equal, round, and reactive to light.  Neck: Normal range of motion. Neck supple.  Cardiovascular: Intact distal pulses.   Respiratory: Effort normal.  Musculoskeletal:  The left total knee has a range of motion from 0-115, collateral ligaments are stable.  Wound is healed there is no effusion.  The right knee has an 8 varus deformity, 10 flexion contracture, crepitus as you taken through range of motion with a 1+ effusion.  Neurological: He is alert and oriented to person, place, and time.  Skin: Skin is warm and dry.  Psychiatric: He has a normal mood and affect. His behavior is normal. Judgment and thought content normal.    Vital signs in last 24 hours:    Labs:   Estimated body mass index is 43.16 kg/(m^2) as calculated from the following:   Height as of 12/19/12: 5\' 11"  (1.803 m).   Weight as of 12/19/12: 140.3 kg (309 lb 4.9 oz).   Imaging Review RADIOGRAPHS:  X-rays were ordered, performed, and interpreted by me today included; standing AP and lateral  x-rays of the left knee showed bone-on-bone arthritis.  There is also lateral subluxation of the tibia beneath the femur.  Interestingly, the right knee has similar changes.  Assessment/Plan:  End stage arthritis, right knee   The patient history, physical examination, clinical judgment of the provider and imaging studies are consistent with end stage degenerative joint disease of the right knee(s) and total knee arthroplasty is deemed medically necessary. The treatment options  including medical management, injection therapy arthroscopy and arthroplasty were discussed at length. The risks and benefits of total knee arthroplasty were presented and reviewed. The risks due to aseptic loosening, infection, stiffness, patella tracking problems, thromboembolic complications and other imponderables were discussed. The patient acknowledged the explanation, agreed to proceed with the plan and consent was signed. Patient is being admitted for inpatient treatment for surgery, pain control, PT, OT, prophylactic antibiotics, VTE prophylaxis, progressive ambulation and ADL's and discharge planning. The patient is planning to be discharged home with home health services

## 2013-05-20 ENCOUNTER — Inpatient Hospital Stay (HOSPITAL_COMMUNITY): Admission: RE | Admit: 2013-05-20 | Payer: Self-pay | Source: Ambulatory Visit

## 2013-05-20 ENCOUNTER — Encounter (HOSPITAL_COMMUNITY)
Admission: RE | Admit: 2013-05-20 | Discharge: 2013-05-20 | Disposition: A | Discharge status: Home / Self Care | Payer: Non-veteran care | Source: Ambulatory Visit | Attending: Orthopedic Surgery | Admitting: Orthopedic Surgery

## 2013-05-25 ENCOUNTER — Encounter (HOSPITAL_COMMUNITY): Admission: RE | Payer: Self-pay | Source: Ambulatory Visit

## 2013-05-25 ENCOUNTER — Inpatient Hospital Stay (HOSPITAL_COMMUNITY): Admission: RE | Admit: 2013-05-25 | Payer: Non-veteran care | Source: Ambulatory Visit | Admitting: Orthopedic Surgery

## 2013-05-25 SURGERY — ARTHROPLASTY, KNEE, TOTAL
Anesthesia: Choice | Laterality: Right

## 2014-10-24 IMAGING — CR DG CHEST 2V
2 series · 2 of 2 positions shown · non-contrast
Comparison: None.

CLINICAL DATA: Preoperative respiratory exam.  Severe
osteoarthritis of the left knee.

CHEST - 2 VIEW

[w chest pa]
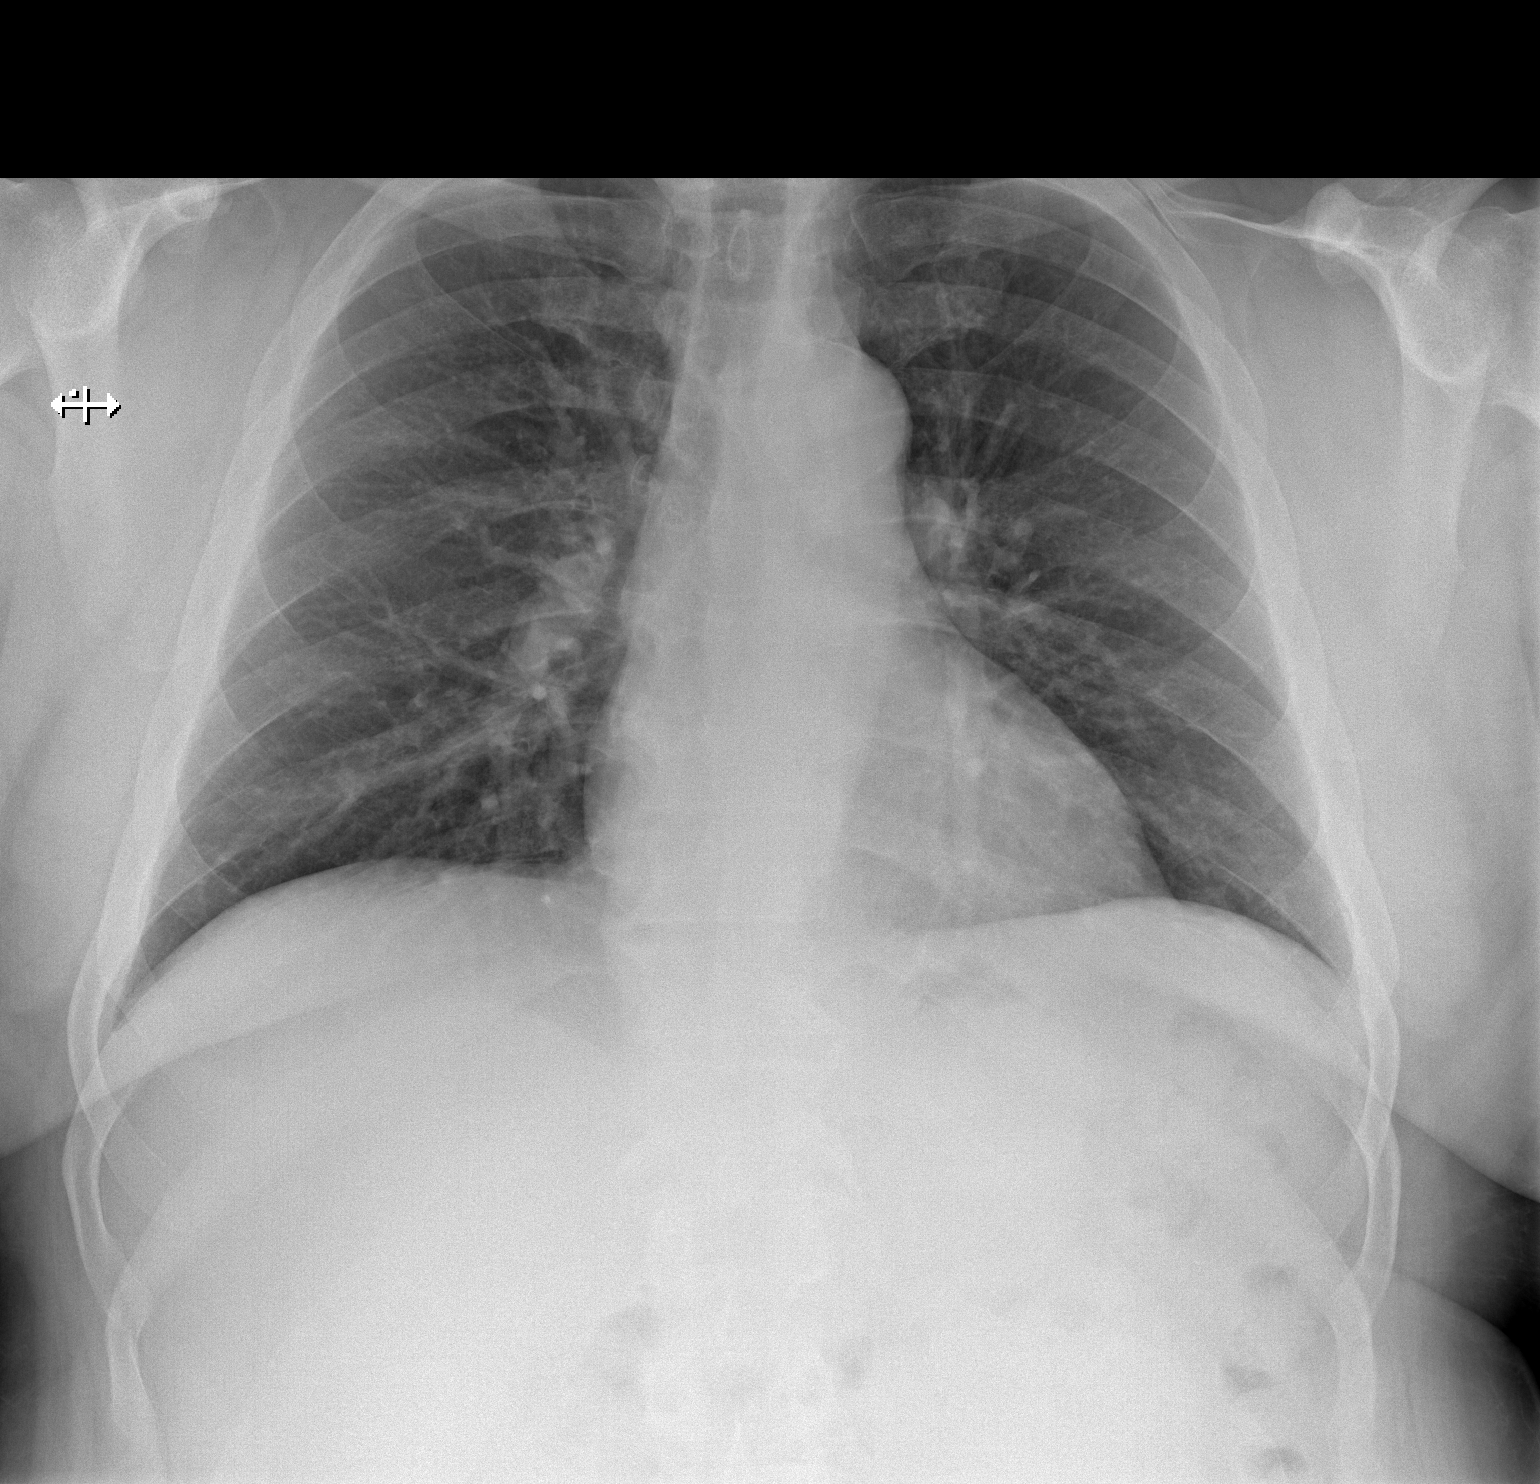

[w chest lat]
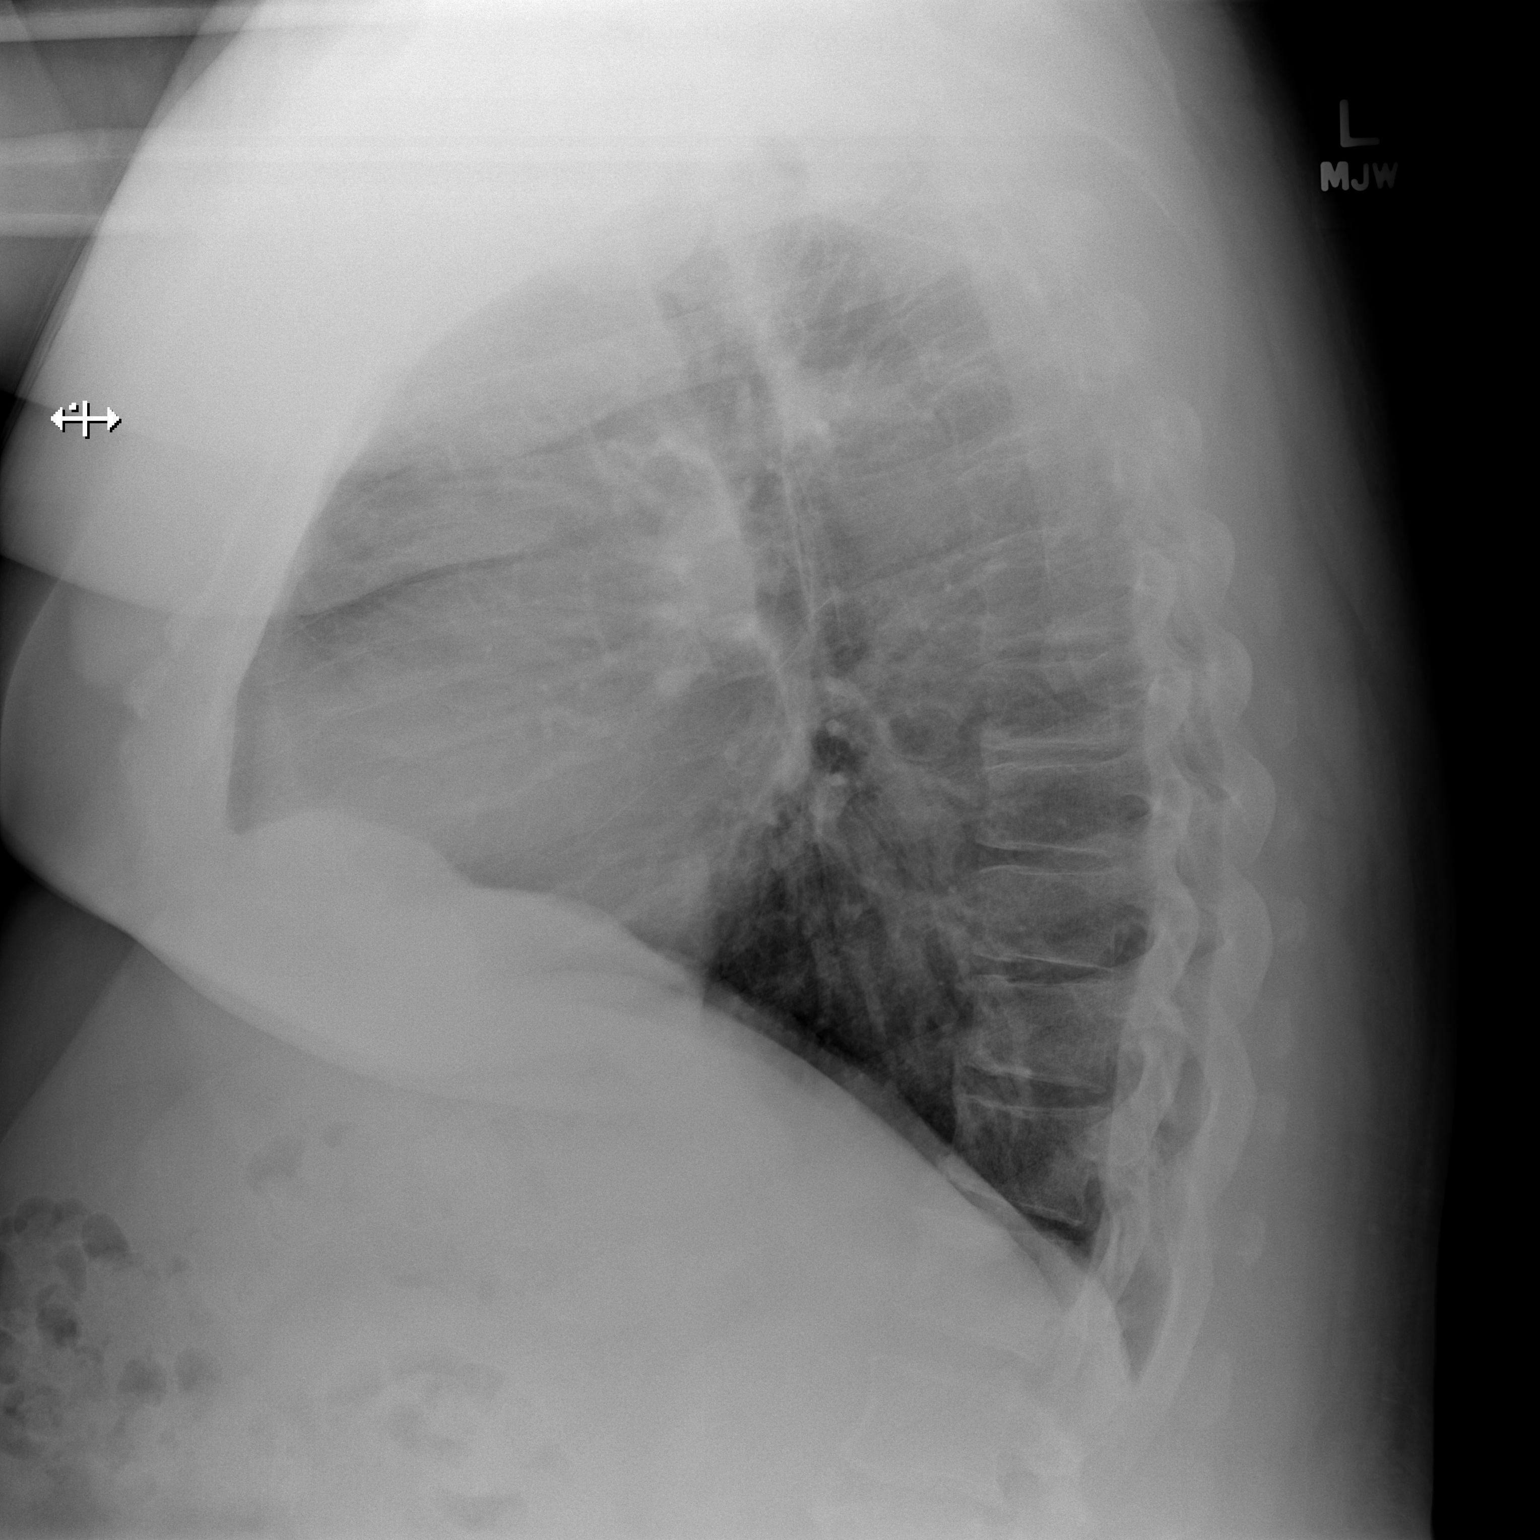

[2 of 2 positions shown; findings below may reference images not displayed]

FINDINGS: The heart size and pulmonary vascularity are normal and
the lungs are clear.  No osseous abnormality.
IMPRESSION: Normal chest.

## 2015-05-27 ENCOUNTER — Emergency Department
Admission: EM | Admit: 2015-05-27 | Discharge: 2015-05-27 | Disposition: A | Discharge status: Home / Self Care | Payer: 59 | Attending: Emergency Medicine | Admitting: Emergency Medicine

## 2015-05-27 ENCOUNTER — Encounter: Payer: Self-pay | Admitting: Emergency Medicine

## 2015-05-27 ENCOUNTER — Emergency Department: Payer: 59

## 2015-05-27 DIAGNOSIS — G8929 Other chronic pain: Secondary | ICD-10-CM | POA: Diagnosis not present

## 2015-05-27 DIAGNOSIS — M549 Dorsalgia, unspecified: Secondary | ICD-10-CM | POA: Diagnosis not present

## 2015-05-27 DIAGNOSIS — R109 Unspecified abdominal pain: Secondary | ICD-10-CM | POA: Diagnosis present

## 2015-05-27 DIAGNOSIS — Z9889 Other specified postprocedural states: Secondary | ICD-10-CM | POA: Diagnosis not present

## 2015-05-27 DIAGNOSIS — I10 Essential (primary) hypertension: Secondary | ICD-10-CM | POA: Diagnosis not present

## 2015-05-27 DIAGNOSIS — Z79899 Other long term (current) drug therapy: Secondary | ICD-10-CM | POA: Diagnosis not present

## 2015-05-27 DIAGNOSIS — R42 Dizziness and giddiness: Secondary | ICD-10-CM | POA: Insufficient documentation

## 2015-05-27 DIAGNOSIS — Z7982 Long term (current) use of aspirin: Secondary | ICD-10-CM | POA: Diagnosis not present

## 2015-05-27 DIAGNOSIS — Z87891 Personal history of nicotine dependence: Secondary | ICD-10-CM | POA: Diagnosis not present

## 2015-05-27 LAB — CBC WITH DIFFERENTIAL/PLATELET
BASOS ABS: 0.1 10*3/uL (ref 0–0.1)
EOS ABS: 0.1 10*3/uL (ref 0–0.7)
HCT: 42.8 % (ref 40.0–52.0)
Hemoglobin: 14.3 g/dL (ref 13.0–18.0)
Lymphocytes Relative: 17 %
Lymphs Abs: 1 10*3/uL (ref 1.0–3.6)
MCH: 27.1 pg (ref 26.0–34.0)
MCHC: 33.4 g/dL (ref 32.0–36.0)
MCV: 81.1 fL (ref 80.0–100.0)
MONO ABS: 0.4 10*3/uL (ref 0.2–1.0)
Monocytes Relative: 8 %
NEUTROS ABS: 4 10*3/uL (ref 1.4–6.5)
Neutrophils Relative %: 72 %
PLATELETS: 234 10*3/uL (ref 150–440)
RBC: 5.28 MIL/uL (ref 4.40–5.90)
RDW: 15.7 % — AB (ref 11.5–14.5)
WBC: 5.1 10*3/uL (ref 3.8–10.6)

## 2015-05-27 LAB — BASIC METABOLIC PANEL
ANION GAP: 7 (ref 5–15)
BUN: 13 mg/dL (ref 6–20)
CALCIUM: 8.9 mg/dL (ref 8.9–10.3)
CO2: 24 mmol/L (ref 22–32)
Chloride: 105 mmol/L (ref 101–111)
Creatinine, Ser: 0.92 mg/dL (ref 0.61–1.24)
Glucose, Bld: 142 mg/dL — ABNORMAL HIGH (ref 65–99)
Potassium: 3.9 mmol/L (ref 3.5–5.1)
SODIUM: 136 mmol/L (ref 135–145)

## 2015-05-27 LAB — LIPASE, BLOOD: Lipase: 25 U/L (ref 11–51)

## 2015-05-27 MED ORDER — MECLIZINE HCL 25 MG PO TABS: 25.0000 mg | ORAL_TABLET | Freq: Three times a day (TID) | ORAL | Status: AC | PRN

## 2015-05-27 MED ORDER — MECLIZINE HCL 25 MG PO TABS
25.0000 mg | ORAL_TABLET | Freq: Once | ORAL | Status: AC
  Administered 2015-05-27: 25 mg via ORAL
  Filled 2015-05-27: qty 1

## 2015-05-27 MED ORDER — SODIUM CHLORIDE 0.9 % IV BOLUS (SEPSIS)
1000.0000 mL | Freq: Once | INTRAVENOUS | Status: AC
  Administered 2015-05-27: 1000 mL via INTRAVENOUS

## 2015-05-27 NOTE — Discharge Instructions (Signed)
Take meclizine as needed for vertigo. Follow up with her regular doctors at the [facility]. Return to the there are some emergency department if you're having worsening pain, if you have fainting spells, or feel other urgent concerns.  Vertigo Vertigo means that you feel like you are moving when you are not. Vertigo can also make you feel like things around you are moving when they are not. This feeling can come and go at any time. Vertigo often goes away on its own. HOME CARE  Avoid making fast movements.  Avoid driving.  Avoid using heavy machinery.  Avoid doing any task or activity that might cause danger to you or other people if you would have a vertigo attack while you are doing it.  Sit down right away if you feel dizzy or have trouble with your balance.  Take over-the-counter and prescription medicines only as told by your doctor.  Follow instructions from your doctor about which positions or movements you should avoid.  Drink enough fluid to keep your pee (urine) clear or pale yellow.  Keep all follow-up visits as told by your doctor. This is important. GET HELP IF:  Medicine does not help your vertigo.  You have a fever.  Your problems get worse or you have new symptoms.  Your family or friends see changes in your behavior.  You feel sick to your stomach (nauseous) or you throw up (vomit).  You have a "pins and needles" feeling or you are numb in part of your body. GET HELP RIGHT AWAY IF:  You have trouble moving or talking.  You are always dizzy.  You pass out (faint).  You get very bad headaches.  You feel weak or have trouble using your hands, arms, or legs.  You have changes in your hearing.  You have changes in your seeing (vision).  You get a stiff neck.  Bright light starts to bother you.   This information is not intended to replace advice given to you by your health care provider. Make sure you discuss any questions you have with your health care  provider.   Document Released: 02/14/2008 Document Revised: 01/26/2015 Document Reviewed: 08/30/2014 Elsevier Interactive Patient Education [employer]2016 Elsevier Inc.

## 2015-05-27 NOTE — ED Notes (Signed)
Patient transported to X-ray 

## 2015-05-27 NOTE — ED Notes (Signed)
MD at bedside. 

## 2015-05-27 NOTE — ED Provider Notes (Signed)
[facility] Emergency Department Provider Note  ____________________________________________  Time seen: 750   I have reviewed the triage vital signs and the nursing notes.  History by:  Patient  HISTORY  Chief Complaint Abdominal Pain  dizziness    HPI Marcus Turner is a 50 y.o. male who presents to the emergency department with multiple complaints. He has a chief complaint of abdominal pain, 3 weeks status post colonoscopy, however when I first asked him what is wrong he speaks of feeling dizzy, "like I might pass out".  The patient's abdominal pain has been present for many years. He speaks of it being present back in 2002, but more notably over the past year and a half. He had a colonoscopy 3 weeks ago. They did see one polyp which he tells me they removed, but he tells me that the prep was inadequate and they have rescheduled him for another colonoscopy. His abdominal pain is in the central abdomen. He denies any nausea vomiting or diarrhea.  The patient complains of feeling dizzy. Through my questioning, it becomes more and more apparent that this is vertigo-like symptoms and not near syncope. He reports it is worse with movement. He denies any fainting spells. He denies any changes in vision or darkening of vision when he has the dizziness. He denies any chest pain or palpitations. The dizziness has been present for months.  When asked what was new and different, the patient reports he came to the emergency department today due to his concerns for his blood pressure. He took his blood pressure at home and it was 140/83.  Patient also complains of some lower back pain "because I'm going to a chiropractor". Apparently has a problem with lumbar 1 and 2. This was noted when he had discomfort when he sat up but is not part of his chief complaints.   Past Medical History  Diagnosis Date  . PONV (postoperative nausea and vomiting)     some nausea...no vomiting   . Hypertension   . H/O hiatal hernia     Patient Active Problem List   Diagnosis Date Noted  . Osteoarthritis of left knee 12/21/2012    Past Surgical History  Procedure Laterality Date  . Knee arthroscopy w/ meniscal repair      right  . Hernia repair      ventral  . 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 amLODipine (NORVASC) 5 MG tablet   Oral   Take 5 mg by mouth daily.         Marland Kitchen. ibuprofen (ADVIL,MOTRIN) 800 MG tablet   Oral   Take 800 mg by mouth every 8 (eight) hours as needed.         Marland Kitchen. lisinopril (PRINIVIL,ZESTRIL) 5 MG tablet   Oral   Take 5 mg by mouth daily.         Marland Kitchen. aspirin EC 325 MG tablet   Oral   Take 1 tablet (325 mg total) by mouth 2 (two) times daily.   30 tablet   0   . meclizine (ANTIVERT) 25 MG tablet   Oral   Take 1 tablet (25 mg total) by mouth 3 (three) times daily as needed for dizziness.   15 tablet   1   .  methocarbamol (ROBAXIN) 500 MG tablet   Oral   Take 1 tablet (500 mg total) by mouth 4 (four) times daily. Patient not taking: Reported on 05/27/2015   60 tablet   0   . oxyCODONE-acetaminophen (ROXICET) 5-325 MG per tablet   Oral   Take 1-2 tablets by mouth every 4 (four) hours as needed for pain.   60 tablet   0     Allergies Tramadol  No family history on file.  Social History Social History  Substance Use Topics  . Smoking status: Former Smoker -- 1.00 packs/day for 4 years  . Smokeless tobacco: None  . Alcohol Use: Yes     Comment: occasionally    Review of Systems  Constitutional: Negative for fever/chills. ENT: Negative for congestion. Cardiovascular: Negative for chest pain. Respiratory: Negative for cough. Gastrointestinal: Positive for abdominal pain. Recent colonoscopy. See history of present  illness Genitourinary: Negative for dysuria. Musculoskeletal: History of chronic back pain. See history of present illness Skin: Negative for rash. Neurological: Positive for dizziness/vertigo. See history of present illness   10-point ROS otherwise negative.  ____________________________________________   PHYSICAL EXAM:  VITAL SIGNS: ED Triage Vitals  Enc Vitals Group     BP 05/27/15 0726 131/89 mmHg     Pulse Rate 05/27/15 0726 76     Resp 05/27/15 0726 20     Temp 05/27/15 0726 97.4 F (36.3 C)     Temp Source 05/27/15 0726 Oral     SpO2 05/27/15 0726 97 %     Weight 05/27/15 0726 320 lb (145.151 kg)     Height 05/27/15 0726 5\' 11"  (1.803 m)     Head Cir --      Peak Flow --      Pain Score 05/27/15 0727 8     Pain Loc --      Pain Edu? --      Excl. in GC? --     Constitutional:  Alert and oriented. Well appearing and in no distress. ENT   Head: Normocephalic and atraumatic.   Nose: No congestion/rhinnorhea.       Mouth: No erythema, no swelling   Cardiovascular: Normal rate, regular rhythm, no murmur noted Respiratory:  Normal respiratory effort, no tachypnea.    Breath sounds are clear and equal bilaterally.  Gastrointestinal: Soft, no distention. Nontender Back: No muscle spasm, no tenderness, no CVA tenderness. Musculoskeletal: No deformity noted. Nontender with normal range of motion in all extremities.  No noted edema. Neurologic:  Communicative. Alert. Interactive. Grip strength equal bilaterally. Negative pronator drift. Good finger to nose coordination. Sensation intact. 5 or 5 strength in all 4 extremities. Cranial nerves II through XII are intact.  Skin:  Skin is warm, dry. No rash noted. Psychiatric: Mood and affect are normal, though piecing through the interview was a little bit difficult. The patient does give a great deal of detail and speaks of long-term complaints as though they were relatively  new. ____________________________________________    LABS (pertinent positives/negatives)  Labs Reviewed  CBC WITH DIFFERENTIAL/PLATELET - Abnormal; Notable for the following:    RDW 15.7 (*)    All other components within normal limits  BASIC METABOLIC PANEL - Abnormal; Notable for the following:    Glucose, Bld 142 (*)    All other components within normal limits  LIPASE, BLOOD     ____________________________________________   EKG  ED ECG REPORT I, Fransheska Willingham W, the attending physician, personally viewed and interpreted this ECG.   Date:  05/27/2015  EKG Time: 751  Rate: 80  Rhythm: sinus rhythm with one premature atrial contraction noted.  Axis: Normal  Intervals: Computer reports a QTC of 634. The T wave is present prior to the midpoint between QRSs.  ST&T Change: Some T-wave flattening. No skin changes.   ____________________________________________    RADIOLOGY  Abdominal x-ray with chest x-ray: IMPRESSION: Negative abdominal radiographs. No acute cardiopulmonary disease. ____________________________________________   PROCEDURES  ____________________________________________   INITIAL IMPRESSION / ASSESSMENT AND PLAN / ED COURSE  Pertinent labs & imaging results that were available during my care of the patient were reviewed by me and considered in my medical decision making (see chart for details).  50 year old male with multiple chronic medical complaints. His acute issue seems to be ongoing vertigo. We'll treat with meclizine. Due to the abdominal pain, 3 weeks status post colonoscopy, we obtain a three-way of his abdomen.  ----------------------------------------- 11:32 AM on 05/27/2015 -----------------------------------------  Patient feels much better after the meclizine. He appears cheerful and upbeat. She is appreciative of care see. His blood tests and x-ray overall look well. We will prescribe meclizine for ongoing treatment of his  vertigo. I have advised her to follow-up with his regular doctors at the [place].  ____________________________________________   FINAL CLINICAL IMPRESSION(S) / ED DIAGNOSES  Final diagnoses:  Vertigo  Chronic abdominal pain      Darien Ramus, MD 05/27/15 1135

## 2015-05-27 NOTE — ED Notes (Signed)
Lower abd pain with some dizziness and nausea  S/p colonoscopy about 3 weeks ago

## 2015-05-27 NOTE — ED Notes (Signed)
AAOx3.  Skin warm and dry.  Moving all extremities equally and strong. Ambulates with easy and steady gait.   

## 2017-01-19 DEATH — deceased
